# Patient Record
Sex: Female | Born: 1975 | Race: White | Hispanic: No | Marital: Married | Smoking: Former smoker
Health system: Southern US, Community
[De-identification: ages and names within clinical notes are randomized; demographics above are authoritative.]

## PROBLEM LIST (undated history)

## (undated) DIAGNOSIS — N760 Acute vaginitis: Secondary | ICD-10-CM

## (undated) DIAGNOSIS — B9689 Other specified bacterial agents as the cause of diseases classified elsewhere: Secondary | ICD-10-CM

## (undated) DIAGNOSIS — R102 Pelvic and perineal pain: Secondary | ICD-10-CM

## (undated) DIAGNOSIS — N63 Unspecified lump in unspecified breast: Secondary | ICD-10-CM

## (undated) DIAGNOSIS — B009 Herpesviral infection, unspecified: Secondary | ICD-10-CM

## (undated) DIAGNOSIS — N390 Urinary tract infection, site not specified: Secondary | ICD-10-CM

## (undated) DIAGNOSIS — IMO0002 Reserved for concepts with insufficient information to code with codable children: Secondary | ICD-10-CM

## (undated) HISTORY — DX: Reserved for concepts with insufficient information to code with codable children: IMO0002

## (undated) HISTORY — DX: Unspecified lump in unspecified breast: N63.0

## (undated) HISTORY — DX: Pelvic and perineal pain: R10.2

## (undated) HISTORY — DX: Acute vaginitis: N76.0

## (undated) HISTORY — DX: Other specified bacterial agents as the cause of diseases classified elsewhere: B96.89

## (undated) HISTORY — DX: Herpesviral infection, unspecified: B00.9

## (undated) HISTORY — DX: Urinary tract infection, site not specified: N39.0

---

## 2001-04-02 ENCOUNTER — Other Ambulatory Visit: Admission: RE | Admit: 2001-04-02 | Discharge: 2001-04-02 | Payer: Self-pay | Admitting: Obstetrics and Gynecology

## 2002-04-07 ENCOUNTER — Other Ambulatory Visit: Admission: RE | Admit: 2002-04-07 | Discharge: 2002-04-07 | Payer: Self-pay | Admitting: Obstetrics and Gynecology

## 2003-05-04 ENCOUNTER — Other Ambulatory Visit: Admission: RE | Admit: 2003-05-04 | Discharge: 2003-05-04 | Payer: Self-pay | Admitting: Obstetrics and Gynecology

## 2004-05-30 ENCOUNTER — Other Ambulatory Visit: Admission: RE | Admit: 2004-05-30 | Discharge: 2004-05-30 | Payer: Self-pay | Admitting: Obstetrics and Gynecology

## 2005-06-12 ENCOUNTER — Other Ambulatory Visit: Admission: RE | Admit: 2005-06-12 | Discharge: 2005-06-12 | Payer: Self-pay | Admitting: Obstetrics and Gynecology

## 2007-03-07 ENCOUNTER — Encounter: Admission: RE | Admit: 2007-03-07 | Discharge: 2007-03-07 | Payer: Self-pay | Admitting: Obstetrics and Gynecology

## 2007-06-18 DIAGNOSIS — IMO0002 Reserved for concepts with insufficient information to code with codable children: Secondary | ICD-10-CM

## 2007-06-18 HISTORY — DX: Reserved for concepts with insufficient information to code with codable children: IMO0002

## 2007-07-17 ENCOUNTER — Ambulatory Visit: Payer: Self-pay | Admitting: Family Medicine

## 2007-09-09 HISTORY — PX: COLPOSCOPY: SHX161

## 2007-10-18 ENCOUNTER — Emergency Department (HOSPITAL_BASED_OUTPATIENT_CLINIC_OR_DEPARTMENT_OTHER): Admission: EM | Admit: 2007-10-18 | Discharge: 2007-10-18 | Payer: Self-pay | Admitting: Emergency Medicine

## 2008-08-03 ENCOUNTER — Emergency Department (HOSPITAL_COMMUNITY): Admission: EM | Admit: 2008-08-03 | Discharge: 2008-08-03 | Payer: Self-pay | Admitting: Family Medicine

## 2008-09-21 ENCOUNTER — Emergency Department (HOSPITAL_BASED_OUTPATIENT_CLINIC_OR_DEPARTMENT_OTHER): Admission: EM | Admit: 2008-09-21 | Discharge: 2008-09-21 | Payer: Self-pay | Admitting: Emergency Medicine

## 2008-09-21 ENCOUNTER — Ambulatory Visit: Payer: Self-pay | Admitting: Radiology

## 2009-02-06 ENCOUNTER — Emergency Department (HOSPITAL_COMMUNITY): Admission: EM | Admit: 2009-02-06 | Discharge: 2009-02-06 | Payer: Self-pay | Admitting: Family Medicine

## 2010-05-25 ENCOUNTER — Other Ambulatory Visit: Payer: Self-pay | Admitting: Obstetrics and Gynecology

## 2010-05-25 DIAGNOSIS — Z1239 Encounter for other screening for malignant neoplasm of breast: Secondary | ICD-10-CM

## 2010-05-25 DIAGNOSIS — Z1231 Encounter for screening mammogram for malignant neoplasm of breast: Secondary | ICD-10-CM

## 2010-06-02 ENCOUNTER — Ambulatory Visit
Admission: RE | Admit: 2010-06-02 | Discharge: 2010-06-02 | Disposition: A | Discharge status: Home / Self Care | Payer: BC Managed Care – PPO | Source: Ambulatory Visit | Attending: Obstetrics and Gynecology | Admitting: Obstetrics and Gynecology

## 2010-06-02 DIAGNOSIS — Z1231 Encounter for screening mammogram for malignant neoplasm of breast: Secondary | ICD-10-CM

## 2010-07-28 LAB — POCT I-STAT, CHEM 8
BUN: 7 mg/dL (ref 6–23)
Calcium, Ion: 1.11 mmol/L — ABNORMAL LOW (ref 1.12–1.32)
Chloride: 104 mEq/L (ref 96–112)
Creatinine, Ser: 0.8 mg/dL (ref 0.4–1.2)
Glucose, Bld: 90 mg/dL (ref 70–99)
Hemoglobin: 15 g/dL (ref 12.0–15.0)
Potassium: 3.8 mEq/L (ref 3.5–5.1)
TCO2: 24 mmol/L (ref 0–100)

## 2010-08-03 LAB — HIV ANTIBODY (ROUTINE TESTING W REFLEX): HIV: NONREACTIVE

## 2011-01-19 LAB — URINALYSIS, ROUTINE W REFLEX MICROSCOPIC
Bilirubin Urine: NEGATIVE
Protein, ur: NEGATIVE
Urobilinogen, UA: 0.2
pH: 5.5

## 2011-01-19 LAB — CBC
Hemoglobin: 13.7
MCHC: 34.7
MCV: 84.7
RDW: 12.4

## 2011-01-19 LAB — PREGNANCY, URINE: Preg Test, Ur: NEGATIVE

## 2011-03-12 ENCOUNTER — Encounter: Payer: Self-pay | Admitting: *Deleted

## 2011-03-12 ENCOUNTER — Emergency Department (HOSPITAL_BASED_OUTPATIENT_CLINIC_OR_DEPARTMENT_OTHER)
Admission: EM | Admit: 2011-03-12 | Discharge: 2011-03-13 | Disposition: A | Discharge status: Home / Self Care | Payer: BC Managed Care – PPO | Attending: Emergency Medicine | Admitting: Emergency Medicine

## 2011-03-12 ENCOUNTER — Emergency Department (INDEPENDENT_AMBULATORY_CARE_PROVIDER_SITE_OTHER): Payer: BC Managed Care – PPO

## 2011-03-12 DIAGNOSIS — E041 Nontoxic single thyroid nodule: Secondary | ICD-10-CM

## 2011-03-12 DIAGNOSIS — R220 Localized swelling, mass and lump, head: Secondary | ICD-10-CM | POA: Insufficient documentation

## 2011-03-12 DIAGNOSIS — J029 Acute pharyngitis, unspecified: Secondary | ICD-10-CM

## 2011-03-12 LAB — BASIC METABOLIC PANEL
Calcium: 9.4 mg/dL (ref 8.4–10.5)
Creatinine, Ser: 0.6 mg/dL (ref 0.50–1.10)
GFR calc Af Amer: >90 mL/min (ref >90)
GFR calc non Af Amer: >90 mL/min (ref >90)

## 2011-03-12 LAB — DIFFERENTIAL
Eosinophils Absolute: 0.1 10*3/uL (ref 0.0–0.7)
Eosinophils Relative: 2 % (ref 0–5)
Lymphs Abs: 1.8 10*3/uL (ref 0.7–4.0)
Monocytes Absolute: 0.8 10*3/uL (ref 0.1–1.0)
Monocytes Relative: 11 % (ref 3–12)
Neutrophils Relative %: 62 % (ref 43–77)

## 2011-03-12 LAB — CBC
HCT: 40.4 % (ref 36.0–46.0)
MCHC: 34.7 g/dL (ref 30.0–36.0)
MCV: 83.8 fL (ref 78.0–100.0)
Platelets: 232 10*3/uL (ref 150–400)
RBC: 4.82 MIL/uL (ref 3.87–5.11)

## 2011-03-12 MED ORDER — IOHEXOL 300 MG/ML  SOLN
80.0000 mL | Freq: Once | INTRAMUSCULAR | Status: AC | PRN
  Administered 2011-03-12: 80 mL via INTRAVENOUS

## 2011-03-12 NOTE — ED Provider Notes (Signed)
History     CSN: [id number] Arrival date & time: 03/12/2011  8:24 PM   First MD Initiated Contact with Patient 03/12/11 2111      Chief Complaint  Patient presents with  . neck swelling     (Consider location/radiation/quality/duration/timing/severity/associated sxs/prior treatment) HPI Comments: Pt comes in c/o swelling to her neck that started this morning:pt states that she has not had cough, fever, sore throat, congestion or injury:pt denies any history of similar symptoms  The history is provided by the patient. No language interpreter was used.    History reviewed. No pertinent past medical history.  History reviewed. No pertinent past surgical history.  History reviewed. No pertinent family history.  History  Substance Use Topics  . Smoking status: Never Smoker   . Smokeless tobacco: Not on file  . Alcohol Use: No    OB History    Grav Para Term Preterm Abortions TAB SAB Ect Mult Living                  Review of Systems  All other systems reviewed and are negative.    Allergies  Penicillins  Home Medications   Current Outpatient Rx  Name Route Sig Dispense Refill  . THERA M PLUS PO TABS Oral Take 1 tablet by mouth daily.      . ST JOHNS WORT 300 MG PO CAPS Oral Take 300 mg by mouth daily.       BP 111/67  Pulse 86  Temp(Src) 98.8 F (37.1 C) (Oral)  Resp 16  Wt 117 lb (53.071 kg)  SpO2 99%  LMP 03/03/2011  Physical Exam  Nursing note and vitals reviewed. Constitutional: She is oriented to person, place, and time. She appears well-developed and well-nourished.  HENT:  Right Ear: External ear normal.  Left Ear: External ear normal.  Mouth/Throat: Oropharynx is clear and moist.  Eyes: Pupils are equal, round, and reactive to light.  Cardiovascular: Normal rate and regular rhythm.   Pulmonary/Chest: Effort normal and breath sounds normal.  Lymphadenopathy:       Pt has localized swelling to the right lower neck  Neurological: She is  alert and oriented to person, place, and time.  Skin: Skin is warm.  Psychiatric: She has a normal mood and affect.    ED Course  Procedures (including critical care time)  Labs Reviewed - No data to display Ct Soft Tissue Neck W Contrast  03/13/2011  *RADIOLOGY REPORT*  Clinical Data: Next soreness, swelling.  CT NECK WITH CONTRAST  Technique:  Multidetector CT imaging of the neck was performed with intravenous contrast.  Contrast: 80mL OMNIPAQUE IOHEXOL 300 MG/ML IV SOLN  Comparison: None.  Findings: Nasal cavity and nasopharynx within normal limits.  Mild palatine tonsil prominence.  No peritonsillar abscess.  Normal epiglottis.  Normal hypopharynx.  Muscles of mastication within normal limits.  Overlying soft tissues are within normal limits.  Symmetric parotid and submandibular glands.  No lymphadenopathy. The right internal jugular vein is dominant, relatively diminutive on the left.  Bilateral carotid and vertebral arteries are patent. Visualized intracranial contents are within normal limits.  Lung apices are clear.  No acute osseous abnormality.  2.3 cm heterogeneous attenuation nodule within the right lobe of the thyroid gland, deviates the trachea slightly to the left.  IMPRESSION: Mild palatine tonsil prominence without peritonsillar abscess.  2.3 cm nodule within the right lobe of the thyroid gland, measuring 2.3 cm.  Recommend further evaluation with ultrasound and biopsy if warranted.  Original  Report Authenticated By: Waneta Martins, M.D.     1. Thyroid nodule       MDM  Discussed finding with WU:JWJX refer to ent    Medical screening examination/treatment/procedure(s) were conducted as a shared visit with non-physician practitioner(s) and myself.  I personally evaluated the patient during the encounter Osvaldo Human, M.D.     Teressa Lower, NP 03/13/11 [id number]  Carleene Cooper III, MD 03/13/11 1344

## 2011-03-12 NOTE — ED Notes (Signed)
Pt reports neck soreness this am and later her neck began to swell denies breathing difficulty denies recent illness

## 2011-03-13 NOTE — Discharge Instructions (Signed)
Goiter Goiter is an enlarged thyroid gland. The thyroid gland sits at the base of the front of the neck. The gland produces hormones that regulate mood, body temperature, pulse rate, and digestion. Most goiters are painless and are not a cause for serious concern. Goiters and conditions that cause goiters can be treated if necessary.  CAUSES  Common causes of goiter include:  Graves disease (causes too much hormone to be produced [hyperthyroidism]).   Hashimoto's disease (causes too little hormone to be produced [hypothyroidism]).   Thyroiditis (inflammation of the thyroid sometimes caused by virus or pregnancy).   Nodular goiter (small bumps form; sometimes called toxic nodular goiter).   Pregnancy.   Thyroid cancer (very few goiters with nodules are cancerous).   Certain medications.   Radiation exposure.   Iodine deficiency (more common in developing countries in inland populations).  RISK FACTORS Risk factors for goiter include:  A family history of goiter.   Female gender.   Inadequate iodine in the diet.   Age older than 40 years.  SYMPTOMS  Many goiters do not cause symptoms. When symptoms do occur, they may include:  Swelling in the lower part of the neck. This swelling can range from a very small bump to a large lump.   A tight feeling in the throat.   A hoarse voice.  Less commonly, a goiter may result in:  Coughing.   Wheezing.   Difficulty swallowing.   Difficulty breathing.   Bulging neck veins.   Dizziness.  When a goiter is the result of hyperthyroidism, symptoms may include:  Rapid or irregular heart beat.   Sicknessin your stomach (nausea).   Vomiting.   Diarrhea.   Shaking.   Irritable feeling.   Bulging eyes.   Weight loss.   Heat sensitivity.   Anxiety.  When a goiter is the result of hypothyroidism, symptoms may include:  Tiredness.   Dry skin.   Constipation.   Weight gain.   Irregular menstrual cycle.    Depressed mood.   Sensitivity to cold.  DIAGNOSIS  Tests used to diagnose goiter include:  A physical exam.   Blood tests, including thyroid hormone levels and antibody testing.   Ultrasonography, computerized X-ray scan (computed tomography, CT) or computerized magnetic scan (magnetic resonance imaging, MRI).   Thyroid scan (imaging along with safe radioactive injection).   Tissue sample taken (biopsy) of nodules. This is sometimes done to confirm that the nodules are not cancerous.  TREATMENT  Treatment will depend on the cause of the goiter. Treatment may include:  Monitoring. In some cases, no treatment is necessary, and your doctor will monitor yourcondition at regular check ups.   Medications and supplements. Thyroid medication (thyroid hormone replacement) is available for hyperthroidism and hypothyroidism.   If inflammation is the cause, over-the-counter medication or steroid medication may be recommended.   Goiters caused by iodine deficiency can be treated with iodine supplements or changes in diet.   Radioactive iodine treatment. Radioactive iodine is injected into the blood. It travels to the thyroid gland, kills thyroid cells, and reduces the size of the gland. This is only used when the thyroid gland is overactive. Lifelong thyroid hormone medication is often necessary after this treatment.   Surgery. A procedure to remove all or part of the gland may be recommended in severe cases or when cancer is the cause. Hormones can be taken to replace the hormones normally produced by the thyroid.  HOME CARE INSTRUCTIONS   Take medications as directed.     Follow your caregiver's recommendations for any dietary changes.   Follow up with your caregiver for further examination and testing, as directed.  PREVENTION   If you have a family history of goiter, discuss screening with your doctor.   Make sure you are getting enough iodine in your diet.   Use of iodized table  salt can help prevent iodine deficiency.  Document Released: 09/28/2009 Document Revised: 12/21/2010 Document Reviewed: 09/28/2009 Exit[employer] Patient Information 2012 [employer]. 

## 2011-03-13 NOTE — ED Provider Notes (Signed)
12:12 AM Patient is a 35 year old woman who noted a nodule in her right neck today for the first time. Physical exam shows a 2 cm nodule to the right of the trachea, which feels like a thyroid nodule to me, and moves like one with swallowing. CT of the neck with IV contrast has been ordered. In the event the radiologist feels that this is a thyroid nodule, referral to a general surgeon should be offered, for probable ultrasound and aspiration.  Medical screening examination/treatment/procedure(s) were conducted as a shared visit with non-physician practitioner(s) and myself.  I personally evaluated the patient during the encounter Osvaldo Human, M.D.    Carleene Cooper III, MD 03/13/11 [phone]

## 2011-03-22 ENCOUNTER — Other Ambulatory Visit: Payer: Self-pay | Admitting: Otolaryngology

## 2011-03-22 DIAGNOSIS — E041 Nontoxic single thyroid nodule: Secondary | ICD-10-CM

## 2011-03-30 ENCOUNTER — Ambulatory Visit
Admission: RE | Admit: 2011-03-30 | Discharge: 2011-03-30 | Disposition: A | Discharge status: Home / Self Care | Payer: BC Managed Care – PPO | Source: Ambulatory Visit | Attending: Otolaryngology | Admitting: Otolaryngology

## 2011-03-30 DIAGNOSIS — E041 Nontoxic single thyroid nodule: Secondary | ICD-10-CM

## 2011-04-03 ENCOUNTER — Other Ambulatory Visit: Payer: Self-pay | Admitting: Otolaryngology

## 2011-04-03 DIAGNOSIS — E041 Nontoxic single thyroid nodule: Secondary | ICD-10-CM

## 2011-04-05 ENCOUNTER — Other Ambulatory Visit (HOSPITAL_COMMUNITY)
Admission: RE | Admit: 2011-04-05 | Discharge: 2011-04-05 | Disposition: A | Discharge status: Home / Self Care | Payer: BC Managed Care – PPO | Source: Ambulatory Visit | Attending: Interventional Radiology | Admitting: Interventional Radiology

## 2011-04-05 ENCOUNTER — Ambulatory Visit
Admission: RE | Admit: 2011-04-05 | Discharge: 2011-04-05 | Disposition: A | Discharge status: Home / Self Care | Payer: BC Managed Care – PPO | Source: Ambulatory Visit | Attending: Otolaryngology | Admitting: Otolaryngology

## 2011-04-05 DIAGNOSIS — E041 Nontoxic single thyroid nodule: Secondary | ICD-10-CM

## 2011-04-05 DIAGNOSIS — E049 Nontoxic goiter, unspecified: Secondary | ICD-10-CM | POA: Insufficient documentation

## 2011-10-11 ENCOUNTER — Other Ambulatory Visit: Payer: Self-pay | Admitting: Otolaryngology

## 2011-10-11 DIAGNOSIS — E041 Nontoxic single thyroid nodule: Secondary | ICD-10-CM

## 2011-11-22 ENCOUNTER — Encounter: Payer: BC Managed Care – PPO | Admitting: Obstetrics and Gynecology

## 2011-11-24 ENCOUNTER — Encounter: Payer: BC Managed Care – PPO | Admitting: Obstetrics and Gynecology

## 2011-11-27 ENCOUNTER — Encounter: Payer: BC Managed Care – PPO | Admitting: Obstetrics and Gynecology

## 2012-06-19 ENCOUNTER — Ambulatory Visit: Payer: BC Managed Care – PPO | Admitting: Obstetrics and Gynecology

## 2017-01-06 ENCOUNTER — Emergency Department (HOSPITAL_BASED_OUTPATIENT_CLINIC_OR_DEPARTMENT_OTHER)
Admission: EM | Admit: 2017-01-06 | Discharge: 2017-01-06 | Disposition: A | Discharge status: Home / Self Care | Payer: BC Managed Care – PPO | Attending: Emergency Medicine | Admitting: Emergency Medicine

## 2017-01-06 ENCOUNTER — Encounter (HOSPITAL_BASED_OUTPATIENT_CLINIC_OR_DEPARTMENT_OTHER): Payer: Self-pay | Admitting: Emergency Medicine

## 2017-01-06 DIAGNOSIS — K2900 Acute gastritis without bleeding: Secondary | ICD-10-CM | POA: Insufficient documentation

## 2017-01-06 DIAGNOSIS — R1084 Generalized abdominal pain: Secondary | ICD-10-CM | POA: Diagnosis present

## 2017-01-06 DIAGNOSIS — Z79899 Other long term (current) drug therapy: Secondary | ICD-10-CM | POA: Insufficient documentation

## 2017-01-06 LAB — CBC
HEMATOCRIT: 46.1 % — AB (ref 36.0–46.0)
HEMOGLOBIN: 15.8 g/dL — AB (ref 12.0–15.0)
MCH: 30.2 pg (ref 26.0–34.0)
MCHC: 34.3 g/dL (ref 30.0–36.0)
MCV: 88 fL (ref 78.0–100.0)
Platelets: 317 10*3/uL (ref 150–400)
RBC: 5.24 MIL/uL — ABNORMAL HIGH (ref 3.87–5.11)
RDW: 12.8 % (ref 11.5–15.5)
WBC: 6.7 10*3/uL (ref 4.0–10.5)

## 2017-01-06 LAB — COMPREHENSIVE METABOLIC PANEL
ALBUMIN: 4.2 g/dL (ref 3.5–5.0)
ALK PHOS: 69 U/L (ref 38–126)
ALT: 23 U/L (ref 14–54)
ANION GAP: 7 (ref 5–15)
AST: 25 U/L (ref 15–41)
BUN: 9 mg/dL (ref 6–20)
CALCIUM: 9.4 mg/dL (ref 8.9–10.3)
CHLORIDE: 104 mmol/L (ref 101–111)
CO2: 26 mmol/L (ref 22–32)
Creatinine, Ser: 0.65 mg/dL (ref 0.44–1.00)
GFR calc Af Amer: >60 mL/min (ref >60)
GFR calc non Af Amer: >60 mL/min (ref >60)
GLUCOSE: 106 mg/dL — AB (ref 65–99)
POTASSIUM: 4.1 mmol/L (ref 3.5–5.1)
SODIUM: 137 mmol/L (ref 135–145)
Total Bilirubin: 0.5 mg/dL (ref 0.3–1.2)
Total Protein: 7.7 g/dL (ref 6.5–8.1)

## 2017-01-06 LAB — LIPASE, BLOOD: LIPASE: 26 U/L (ref 11–51)

## 2017-01-06 LAB — PREGNANCY, URINE: Preg Test, Ur: NEGATIVE

## 2017-01-06 LAB — URINALYSIS, ROUTINE W REFLEX MICROSCOPIC
BILIRUBIN URINE: NEGATIVE
Glucose, UA: NEGATIVE mg/dL
Hgb urine dipstick: NEGATIVE
Ketones, ur: NEGATIVE mg/dL
Leukocytes, UA: NEGATIVE
NITRITE: NEGATIVE
PH: 5.5 (ref 5.0–8.0)
Protein, ur: NEGATIVE mg/dL

## 2017-01-06 MED ORDER — DICYCLOMINE HCL 20 MG PO TABS: 20.0000 mg | ORAL_TABLET | Freq: Three times a day (TID) | ORAL | 0 refills | Status: DC

## 2017-01-06 MED ORDER — PANTOPRAZOLE SODIUM 20 MG PO TBEC: 40.0000 mg | DELAYED_RELEASE_TABLET | Freq: Every day | ORAL | 0 refills | Status: DC

## 2017-01-06 NOTE — ED Triage Notes (Signed)
patinet reports that for the last 2 -3 weeks she has had intermittent "burning" to her generalized abdominal area around to her bilateral flank. She she also reports that she has had a "cold" for about a week and half for about 1 week. Patient denies any urinary symptoms but reports that she is very fatigued.

## 2017-01-06 NOTE — ED Provider Notes (Signed)
[facility] Provider [facility]   CSN: [id number] Arrival date & time: 01/06/17  1434     History   Chief Complaint Chief Complaint  Patient presents with  . Abdominal Pain    HPI Lauren Duarte is a 41 y.o. female.  HPI  Lower abdominal pain, feels like a burning, dull ache with radiation around to the back, constant dull aching back pain x one month. Back pain present for one week.  Bouts of diarrhea and constipation, has been going on for one month. Constipation for 3 days at times. Today had loose stool.  No regular BM, will have constipation for 3 days, sometimes will take stool softener. Reports cough for 1.5wk, feels like need to cough stuff up but is unable to, when laying down at night will have cough Then not sleeping well at night because up coughing.  No family hx of asthma.  Has had congestion for past 1.5, rhinorhea, usually clear, sometimes slight green color.    No vaginal bleeding or discharge. No concern for STIs, is sexually, one partner. On birth control, last menses 9/8.  Getting IUD on Monday through OBGYN.  She had referred her to Gastroenterologist about the stomach pain.  Had PAP at that time, pelvic exam and they did see any abnormalities, did not think had PID.      Past Medical History:  Diagnosis Date  . Abnormal Pap smear 06/18/07   ASCUS  . Breast nodule    left breast  . BV (bacterial vaginosis)   . HSV-1 (herpes simplex virus 1) infection   . Pelvic pain in female   . UTI (urinary tract infection)     There are no active problems to display for this patient.   Past Surgical History:  Procedure Laterality Date  . COLPOSCOPY  09/09/07    OB History    Gravida Para Term Preterm AB Living   SAB TAB Ectopic Multiple Live Births                   Home Medications    Prior to Admission medications   Medication Sig Start Date End Date Taking? Authorizing Provider  dicyclomine (BENTYL) 20 MG tablet Take 1 tablet (20  mg total) by mouth 3 (three) times daily before meals. 01/06/17   Alvira Monday, MD  IUD's [facility] INTRAUTERINE COPPER IU) by Intrauterine route.    [provider]  Multiple Vitamins-Minerals (MULTIVITAMINS THER. W/MINERALS) TABS Take 1 tablet by mouth daily.      [provider]  pantoprazole (PROTONIX) 20 MG tablet Take 2 tablets (40 mg total) by mouth daily. 01/06/17 01/20/17  Alvira Monday, MD  [facility] Wort 300 MG CAPS Take 300 mg by mouth daily.     [provider]    Family History History reviewed. No pertinent family history.  Social History Social History  Substance Use Topics  . Smoking status: Never Smoker  . Smokeless tobacco: Never Used  . Alcohol use No     Allergies   Penicillins   Review of Systems Review of Systems  Constitutional: Positive for fatigue. Negative for fever.  HENT: Positive for congestion and rhinorrhea. Negative for sore throat (now resolvecd).   Eyes: Negative for visual disturbance.  Respiratory: Positive for cough. Negative for shortness of breath.   Cardiovascular: Negative for chest pain.  Gastrointestinal: Positive for abdominal pain, constipation and diarrhea. Negative for nausea and vomiting.  Genitourinary:  Positive for pelvic pain. Negative for difficulty urinating, vaginal bleeding and vaginal discharge.  Musculoskeletal: Negative for back pain and neck pain.  Skin: Negative for rash.  Neurological: Negative for syncope and headaches.     Physical Exam Updated Vital Signs BP 130/85 (BP Location: Right Arm)   Pulse 67   Temp 98.3 F (36.8 C) (Oral)   Resp 16   Ht 5' 2.5" (1.588 m)   Wt 54.4 kg (120 lb)   LMP 12/30/2016   SpO2 100%   BMI 21.60 kg/m   Physical Exam  Constitutional: She is oriented to person, place, and time. She appears well-developed and well-nourished. No distress.  HENT:  Head: Normocephalic and atraumatic.  Eyes: Conjunctivae and EOM are normal.  Neck: Normal range  of motion.  Cardiovascular: Normal rate, regular rhythm, normal heart sounds and intact distal pulses.  Exam reveals no gallop and no friction rub.   No murmur heard. Pulmonary/Chest: Effort normal and breath sounds normal. No respiratory distress. She has no wheezes. She has no rales.  Abdominal: Soft. She exhibits no distension. There is no tenderness. There is no guarding.  Musculoskeletal: She exhibits no edema or tenderness.  Neurological: She is alert and oriented to person, place, and time.  Skin: Skin is warm and dry. No rash noted. She is not diaphoretic. No erythema.  Nursing note and vitals reviewed.    ED Treatments / Results  Labs (all labs ordered are listed, but only abnormal results are displayed) Labs Reviewed  URINALYSIS, ROUTINE W REFLEX MICROSCOPIC - Abnormal; Notable for the following:       Result Value   Specific Gravity, Urine <1.005 (*)    All other components within normal limits  COMPREHENSIVE METABOLIC PANEL - Abnormal; Notable for the following:    Glucose, Bld 106 (*)    All other components within normal limits  CBC - Abnormal; Notable for the following:    RBC 5.24 (*)    Hemoglobin 15.8 (*)    HCT 46.1 (*)    All other components within normal limits  PREGNANCY, URINE  LIPASE, BLOOD    EKG  EKG Interpretation None       Radiology No results found.  Procedures Procedures (including critical care time)  Medications Ordered in ED Medications - No data to display   Initial Impression / Assessment and Plan / ED Course  I have reviewed the triage vital signs and the nursing notes.  Pertinent labs & imaging results that were available during my care of the patient were reviewed by me and considered in my medical decision making (see chart for details).    41 year old female presents with concern for lower abdominal pain, alternating constipation and diarrhea, cough. CBC shows no anemia or leukocytosis. CMP shows no transaminitis or  abnormal lites. Lipase is within normal limits. Pregnancy test is negative. Urinalysis shows no sign of UTI. History is not consistent with nephrolithiasis. She recently had an evaluation with her OB/GYN including a pelvic exam and there is no concern at that time for PID. Doubt appendicitis, cholecystitis by history and exam.  Reports cough for 1.5 weeks. She has clear breath sounds bilaterally, no hypoxia, no fever, doubt pneumonia.  Symptoms possible gastritis, IBS or other. Recommend continued close outpt follow up. GIven rx for Protonix and Bentyl. Patient discharged in stable condition with understanding of reasons to return.   Final Clinical Impressions(s) / ED Diagnoses   Final diagnoses:  Generalized abdominal pain  Acute gastritis without hemorrhage,  unspecified gastritis type    New Prescriptions Discharge Medication List as of 01/06/2017  5:40 PM    START taking these medications   Details  dicyclomine (BENTYL) 20 MG tablet Take 1 tablet (20 mg total) by mouth 3 (three) times daily before meals., Starting Sat 01/06/2017, Print    pantoprazole (PROTONIX) 20 MG tablet Take 2 tablets (40 mg total) by mouth daily., Starting Sat 01/06/2017, Until Sat 01/20/2017, Print         Alvira Monday, MD 01/08/17 [phone]

## 2017-01-06 NOTE — ED Notes (Signed)
ED Provider at bedside. 

## 2017-11-02 LAB — HM PAP SMEAR

## 2018-10-03 ENCOUNTER — Emergency Department (HOSPITAL_BASED_OUTPATIENT_CLINIC_OR_DEPARTMENT_OTHER): Payer: BC Managed Care – PPO

## 2018-10-03 ENCOUNTER — Other Ambulatory Visit: Payer: Self-pay

## 2018-10-03 ENCOUNTER — Emergency Department (HOSPITAL_BASED_OUTPATIENT_CLINIC_OR_DEPARTMENT_OTHER)
Admission: EM | Admit: 2018-10-03 | Discharge: 2018-10-03 | Disposition: A | Discharge status: Home / Self Care | Payer: BC Managed Care – PPO | Attending: Emergency Medicine | Admitting: Emergency Medicine

## 2018-10-03 ENCOUNTER — Encounter (HOSPITAL_BASED_OUTPATIENT_CLINIC_OR_DEPARTMENT_OTHER): Payer: Self-pay | Admitting: Emergency Medicine

## 2018-10-03 DIAGNOSIS — R200 Anesthesia of skin: Secondary | ICD-10-CM | POA: Diagnosis not present

## 2018-10-03 DIAGNOSIS — R079 Chest pain, unspecified: Secondary | ICD-10-CM | POA: Diagnosis not present

## 2018-10-03 DIAGNOSIS — F17200 Nicotine dependence, unspecified, uncomplicated: Secondary | ICD-10-CM | POA: Insufficient documentation

## 2018-10-03 DIAGNOSIS — R202 Paresthesia of skin: Secondary | ICD-10-CM | POA: Diagnosis not present

## 2018-10-03 LAB — BASIC METABOLIC PANEL
Anion gap: 7 (ref 5–15)
BUN: 14 mg/dL (ref 6–20)
CO2: 24 mmol/L (ref 22–32)
Calcium: 9 mg/dL (ref 8.9–10.3)
Chloride: 105 mmol/L (ref 98–111)
Creatinine, Ser: 0.65 mg/dL (ref 0.44–1.00)
GFR calc Af Amer: >60 mL/min (ref >60)
GFR calc non Af Amer: >60 mL/min (ref >60)
Glucose, Bld: 110 mg/dL — ABNORMAL HIGH (ref 70–99)
Potassium: 3.7 mmol/L (ref 3.5–5.1)
Sodium: 136 mmol/L (ref 135–145)

## 2018-10-03 LAB — CBC WITH DIFFERENTIAL/PLATELET
Abs Immature Granulocytes: 0.02 10*3/uL (ref 0.00–0.07)
Basophils Absolute: 0 10*3/uL (ref 0.0–0.1)
Basophils Relative: 0 %
Eosinophils Absolute: 0.2 10*3/uL (ref 0.0–0.5)
Eosinophils Relative: 3 %
HCT: 45.3 % (ref 36.0–46.0)
Hemoglobin: 14.8 g/dL (ref 12.0–15.0)
Immature Granulocytes: 0 %
Lymphocytes Relative: 34 %
Lymphs Abs: 2.4 10*3/uL (ref 0.7–4.0)
MCH: 29.6 pg (ref 26.0–34.0)
MCHC: 32.7 g/dL (ref 30.0–36.0)
MCV: 90.6 fL (ref 80.0–100.0)
Monocytes Absolute: 0.7 10*3/uL (ref 0.1–1.0)
Monocytes Relative: 9 %
Neutro Abs: 3.8 10*3/uL (ref 1.7–7.7)
Neutrophils Relative %: 54 %
Platelets: 322 10*3/uL (ref 150–400)
RBC: 5 MIL/uL (ref 3.87–5.11)
RDW: 13.1 % (ref 11.5–15.5)
WBC: 7.2 10*3/uL (ref 4.0–10.5)
nRBC: 0 % (ref 0.0–0.2)

## 2018-10-03 LAB — D-DIMER, QUANTITATIVE (NOT AT ARMC): D-Dimer, Quant: 0.38 ug/mL-FEU (ref 0.00–0.50)

## 2018-10-03 LAB — TROPONIN I: Troponin I: <0.03 ng/mL (ref <0.03)

## 2018-10-03 MED ORDER — SODIUM CHLORIDE 0.9 % IV BOLUS: 1000.0000 mL | Freq: Once | INTRAVENOUS | Status: DC

## 2018-10-03 MED ORDER — KETOROLAC TROMETHAMINE 15 MG/ML IJ SOLN
15.0000 mg | Freq: Once | INTRAMUSCULAR | Status: DC
  Filled 2018-10-03: qty 1

## 2018-10-03 MED ORDER — SODIUM CHLORIDE 0.9 % IV BOLUS: 500.0000 mL | Freq: Once | INTRAVENOUS | Status: DC

## 2018-10-03 MED ORDER — IOHEXOL 350 MG/ML SOLN
100.0000 mL | Freq: Once | INTRAVENOUS | Status: AC | PRN
  Administered 2018-10-03: 100 mL via INTRAVENOUS

## 2018-10-03 NOTE — ED Provider Notes (Signed)
[facility] Emergency Department Provider Note MRN:  [id number]  Arrival date & time: 10/03/18     Chief Complaint   Chest Pain   History of Present Illness   Lauren EnsignHeidi Duarte is a 43 y.o. year-old female with no pertinent past medical history presenting to the ED with chief complaint of chest pain.  Chest pain for 2 days, located beneath the left breast, described as sharp, moderate in severity, constant.  Yesterday also began experiencing numbness or paresthesia to the left arm.  Denies headache, no vision change, no fever, no cough, no shortness of breath.  Denies abdominal pain, no bowel or bladder dysfunction, no numbness or weakness to the legs.  Review of Systems  A complete 10 system review of systems was obtained and all systems are negative except as noted in the HPI and PMH.   Patient's Health History    Past Medical History:  Diagnosis Date  . Abnormal Pap smear 06/18/07   ASCUS  . Breast nodule    left breast  . BV (bacterial vaginosis)   . HSV-1 (herpes simplex virus 1) infection   . Pelvic pain in female   . UTI (urinary tract infection)     Past Surgical History:  Procedure Laterality Date  . COLPOSCOPY  09/09/07    No family history on file.  Social History   Socioeconomic History  . Marital status: Single    Spouse name: Not on file  . Number of children: Not on file  . Years of education: Not on file  . Highest education level: Not on file  Occupational History  . Not on file  Social Needs  . Financial resource strain: Not on file  . Food insecurity    Worry: Not on file    Inability: Not on file  . Transportation needs    Medical: Not on file    Non-medical: Not on file  Tobacco Use  . Smoking status: Current Some Day Smoker  . Smokeless tobacco: Never Used  Substance and Sexual Activity  . Alcohol use: Yes  . Drug use: No  . Sexual activity: Not on file  Lifestyle  . Physical activity    Days per week: Not on  file    Minutes per session: Not on file  . Stress: Not on file  Relationships  . Social Musicianconnections    Talks on phone: Not on file    Gets together: Not on file    Attends religious service: Not on file    Active member of club or organization: Not on file    Attends meetings of clubs or organizations: Not on file    Relationship status: Not on file  . Intimate partner violence    Fear of current or ex partner: Not on file    Emotionally abused: Not on file    Physically abused: Not on file    Forced sexual activity: Not on file  Other Topics Concern  . Not on file  Social History Narrative  . Not on file     Physical Exam  Vital Signs and Nursing Notes reviewed Vitals:   10/03/18 0741  BP: 134/82  Pulse: 73  Resp: 18  Temp: (!) 97.3 F (36.3 C)  SpO2: 99%    CONSTITUTIONAL: Well-appearing, NAD NEURO:  Alert and oriented x 3, normal and symmetric strength, subjective minimal decreased sensation to the left arm, no facial droop, no visual field cuts, no aphasia, no dysarthria, no neglect EYES:  eyes equal and reactive ENT/NECK:  no LAD, no JVD CARDIO: Regular rate, well-perfused, normal S1 and S2 PULM:  CTAB no wheezing or rhonchi GI/GU:  normal bowel sounds, non-distended, non-tender MSK/SPINE:  No gross deformities, no edema SKIN:  no rash, atraumatic PSYCH:  Appropriate speech and behavior  Diagnostic and Interventional Summary    EKG Interpretation  Date/Time:  Thursday October 03 2018 07:38:32 EDT Ventricular Rate:  69 PR Interval:    QRS Duration: 89 QT Interval:  411 QTC Calculation: 441 R Axis:   74 Text Interpretation:  Sinus rhythm Baseline wander in lead(s) V6 Confirmed by Gerlene Fee [phone]) on 10/03/2018 9:07:51 AM      Labs Reviewed  BASIC METABOLIC PANEL - Abnormal; Notable for the following components:      Result Value   Glucose, Bld 110 (*)    All other components within normal limits  CBC WITH DIFFERENTIAL/PLATELET  TROPONIN I   D-DIMER, QUANTITATIVE (NOT AT [facility])    CT HEAD WO CONTRAST  Final Result    CT ANGIO CHEST PE W OR WO CONTRAST  Final Result    DG Chest 2 View  Final Result      Medications  ketorolac (TORADOL) 15 MG/ML injection 15 mg (15 mg Intravenous Refused 10/03/18 0824)  sodium chloride 0.9 % bolus 1,000 mL (1,000 mLs Intravenous Refused 10/03/18 0825)  iohexol (OMNIPAQUE) 350 MG/ML injection 100 mL (100 mLs Intravenous Contrast Given 10/03/18 1025)     Procedures Critical Care  ED Course and Medical Decision Making  I have reviewed the triage vital signs and the nursing notes.  Pertinent labs & imaging results that were available during my care of the patient were reviewed by me and considered in my medical decision making (see below for details).  Sharp pleuritic chest pain in this 43 year old female, otherwise healthy.  Considering pulmonary embolism though she is not tachycardic, not hypoxic, no evidence of DVT, not on birth control pills.  Patient is also having sensory symptoms of the left arm, thought to be mostly paresthesias though on exam there is minimal possible subjective numbness as well.  She has a normal neurological exam otherwise and has normal radial pulses, normal cap refill to her extremities.  Dissection should be considered with chest pain and neurological deficit, however this is thought to be very unlikely given her lack of objective findings.  Still, with both PE and dissection on the differential, will obtain a CTA chest imaging to evaluate.  Patient is otherwise healthy without cardiac risk factors, low concern for ACS.  EKG is without ischemic changes.  Labs are reassuring, CT reveals no evidence of PE or dissection.  Upon reevaluation, patient is relieved to hear the news of a negative work-up, she no longer has any subjective sensory deficits, confirming the suspicion that she was experiencing only paresthesia.  Patient is focally tender to the anterior ribs beneath  the left breast, suspect MSK.  Strict return precautions for new or worsening neurological deficits or chest pain.   After the discussed management above, the patient was determined to be safe for discharge.  The patient was in agreement with this plan and all questions regarding their care were answered.  ED return precautions were discussed and the patient will return to the ED with any significant worsening of condition.  Barth Kirks. Sedonia Small, Rachel [email or web address]  Final Clinical Impressions(s) / ED Diagnoses     ICD-10-CM   1. Chest pain,  unspecified type  R07.9     ED Discharge Orders    None         Sabas SousBero, Michael M, MD 10/03/18 [id number]

## 2018-10-03 NOTE — Discharge Instructions (Addendum)
You were evaluated in the Emergency Department and after careful evaluation, we did not find any emergent condition requiring admission or further testing in the hospital.  Your symptoms today seem to be due to muscle strain of the chest wall.  Your labs and CT scans today were reassuring.  Use tylenol or ibuprofen at home for pain.  Please return to the Emergency Department if you experience any worsening of your condition.  We encourage you to follow up with a primary care provider.  Thank you for allowing [place] to be a part of your care.

## 2018-10-03 NOTE — ED Triage Notes (Signed)
L side chest pain x 2 days. Also reports L arm numbness which initially started 2 weeks ago but has been worsening.

## 2018-12-02 LAB — HM MAMMOGRAPHY

## 2019-05-13 ENCOUNTER — Ambulatory Visit: Payer: Self-pay | Admitting: Nurse Practitioner

## 2019-05-29 ENCOUNTER — Encounter: Payer: Self-pay | Admitting: Nurse Practitioner

## 2019-05-29 ENCOUNTER — Other Ambulatory Visit: Payer: Self-pay

## 2019-05-29 ENCOUNTER — Ambulatory Visit: Payer: BC Managed Care – PPO | Admitting: Nurse Practitioner

## 2019-05-29 VITALS — BP 118/80 | HR 72 | Temp 98.6°F | Ht 62.0 in | Wt 136.8 lb

## 2019-05-29 DIAGNOSIS — R1032 Left lower quadrant pain: Secondary | ICD-10-CM

## 2019-05-29 DIAGNOSIS — Z13228 Encounter for screening for other metabolic disorders: Secondary | ICD-10-CM | POA: Diagnosis not present

## 2019-05-29 DIAGNOSIS — Z880 Allergy status to penicillin: Secondary | ICD-10-CM

## 2019-05-29 DIAGNOSIS — Z0000 Encounter for general adult medical examination without abnormal findings: Secondary | ICD-10-CM

## 2019-05-29 LAB — POCT URINALYSIS DIPSTICK
Bilirubin, UA: NEGATIVE
Glucose, UA: NEGATIVE
Ketones, UA: NEGATIVE
Nitrite, UA: NEGATIVE
Protein, UA: POSITIVE — AB
Spec Grav, UA: >=1.03 — AB (ref 1.010–1.025)
Urobilinogen, UA: 0.2 E.U./dL
pH, UA: 5.5 (ref 5.0–8.0)

## 2019-05-29 MED ORDER — EPINEPHRINE 0.3 MG/0.3ML IJ SOAJ: 0.3000 mg | INTRAMUSCULAR | 2 refills | Status: DC | PRN

## 2019-05-29 NOTE — Progress Notes (Addendum)
This visit occurred during the SARS-CoV-2 public health emergency.  Safety protocols were in place, including screening questions prior to the visit, additional usage of staff PPE, and extensive cleaning of exam room while observing appropriate contact time as indicated for disinfecting solutions.  Subjective:     Patient ID: Lauren Duarte , female    DOB: 1975/05/18 , 44 y.o.   MRN: [id number]   Chief Complaint  Patient presents with  . Establish Care  . Annual Exam    HPI  Here to establish care did not have a general practitioner.  She was referred by Dr. Charlesetta Garibaldi. She was seen by Dr. Charlesetta Garibaldi in late summer, had mammogram.  She had her Colonoscopy recently with Dr. Collene Mares - normal. Works as a Pharmacist, hospital, working at [employer]. Single.  She has a 36 year old son who is healthy.   PMH - had been having left lower abdomen pain felt blocked.  She had a goiter 5 years ago, biopsied thyroid levels were slightly elevated and went back to normal.    [facility] - mother - thyroid disease.  Father- occasional elevated blood pressure.  Sister with breast cancer - cancer free for 8 years. Ovarian cancer maternal grandmother.  Maternal grandfather with alzheimer's. Paternal grandmother   Abdominal Pain This is a new problem. The current episode started more than 1 year ago. The onset quality is gradual. The problem occurs intermittently. The pain is located in the LLQ. The quality of the pain is aching. Pertinent negatives include no anorexia. Prior workup: colonoscopy. There is no history of colon cancer.    The patient states she uses IUD for birth control.  Patient's last menstrual period was 05/26/2019.. Negative for Dysmenorrhea and Negative for Menorrhagia Mammogram last done Summer 2020 per patient at Dr. Charlesetta Garibaldi. Negative for: breast discharge, breast lump(s), breast pain and breast self exam.  Pertinent negatives include abnormal bleeding (hematology), anxiety, decreased libido, depression, difficulty  falling sleep, dyspareunia, history of infertility, nocturia, sexual dysfunction, sleep disturbances, urinary incontinence, urinary urgency, vaginal discharge and vaginal itching. Diet regular.The patient states her exercise level is moderate with walking every other day.       The patient's tobacco use is:  Social History   Tobacco Use  Smoking Status Former Smoker  Smokeless Tobacco Never Used  . She has been exposed to passive smoke. The patient's alcohol use is:  Social History   Substance and Sexual Activity  Alcohol Use Yes  Additional information: Last pap done with Dr. Charlesetta Garibaldi will await records  Past Medical History:  Diagnosis Date  . Abnormal Pap smear 06/18/07   ASCUS  . Breast nodule    left breast  . BV (bacterial vaginosis)   . HSV-1 (herpes simplex virus 1) infection   . Pelvic pain in female   . UTI (urinary tract infection)      Family History  Problem Relation Age of Onset  . Thyroid disease Mother   . Healthy Father   . Cancer Sister   . Cancer Maternal Grandmother   . Alzheimer's disease Maternal Grandfather   . Diabetes Paternal Grandmother      Current Outpatient Medications:  .  IUD's (PARAGARD INTRAUTERINE COPPER IU), by Intrauterine route., Disp: , Rfl:  .  Multiple Vitamins-Minerals (MULTIVITAMINS THER. W/MINERALS) TABS, Take 1 tablet by mouth daily.  , Disp: , Rfl:    Allergies  Allergen Reactions  . Penicillins Itching and Swelling     Review of Systems  Constitutional: Negative.  Respiratory: Negative.   Cardiovascular: Negative.   Gastrointestinal: Positive for abdominal pain. Negative for anorexia.  Neurological: Negative.      Today's Vitals   05/29/19 1522  BP: 118/80  Pulse: 72  Temp: 98.6 F (37 C)  TempSrc: Oral  SpO2: 98%  Weight: 136 lb 12.8 oz (62.1 kg)  Height: '5\' 2"'  (1.575 m)  PainSc: 0-No pain   Body mass index is 25.02 kg/m.   Objective:  Physical Exam Constitutional:      General: She is not in  acute distress.    Appearance: Normal appearance.  Eyes:     Extraocular Movements: Extraocular movements intact.     Pupils: Pupils are equal, round, and reactive to light.  Cardiovascular:     Rate and Rhythm: Normal rate and regular rhythm.     Pulses: Normal pulses.     Heart sounds: Normal heart sounds. No murmur.  Pulmonary:     Effort: Pulmonary effort is normal. No respiratory distress.     Breath sounds: Normal breath sounds.  Abdominal:     General: Bowel sounds are normal. There is no distension.     Palpations: Abdomen is soft.  Musculoskeletal:        General: Normal range of motion.  Skin:    General: Skin is warm and dry.     Capillary Refill: Capillary refill takes less than 2 seconds.  Neurological:     General: No focal deficit present.     Mental Status: She is alert and oriented to person, place, and time.  Psychiatric:        Mood and Affect: Mood normal.        Behavior: Behavior normal.        Thought Content: Thought content normal.        Judgment: Judgment normal.         Assessment And Plan:     1. Encounter for screening for metabolic disorder  - Lipid panel - Hemoglobin A1c  2. Health maintenance examination . Behavior modifications discussed and diet history reviewed.   . Pt will continue to exercise regularly and modify diet with low GI, plant based foods and decrease intake of processed foods.  . Recommend intake of daily multivitamin, Vitamin D, and calcium.  . Recommend mammogram for preventive screenings, as well as recommend immunizations that include influenza, TDAP - CMP14+EGFR - Lipid panel  3. Left lower quadrant abdominal pain  No abnormal findings on physical exam - Allergens(96) Foods - CMP14+EGFR - Amylase - Lipase - CBC - POCT Urinalysis Dipstick (81002)  4. Allergy to penicillin  Provided prescription for epi pen at patient request - EPINEPHrine 0.3 mg/0.3 mL IJ SOAJ injection; Inject 0.3 mLs (0.3 mg total) into  the muscle as needed for anaphylaxis.  Dispense: 1 each; Refill: 2   Minette Brine, FNP    THE PATIENT IS ENCOURAGED TO PRACTICE SOCIAL DISTANCING DUE TO THE COVID-19 PANDEMIC.

## 2019-05-29 NOTE — Patient Instructions (Signed)
Health Maintenance  Topic Date Due  . PAP SMEAR-Modifier  05/19/1996  . INFLUENZA VACCINE  07/23/2019 (Originally 11/23/2018)  . TETANUS/TDAP  10/05/2025  . HIV Screening  Completed   Health Maintenance, Female Adopting a healthy lifestyle and getting preventive care are important in promoting health and wellness. Ask your health care provider about:  The right schedule for you to have regular tests and exams.  Things you can do on your own to prevent diseases and keep yourself healthy. What should I know about diet, weight, and exercise? Eat a healthy diet   Eat a diet that includes plenty of vegetables, fruits, low-fat dairy products, and lean protein.  Do not eat a lot of foods that are high in solid fats, added sugars, or sodium. Maintain a healthy weight Body mass index (BMI) is used to identify weight problems. It estimates body fat based on height and weight. Your health care provider can help determine your BMI and help you achieve or maintain a healthy weight. Get regular exercise Get regular exercise. This is one of the most important things you can do for your health. Most adults should:  Exercise for at least 150 minutes each week. The exercise should increase your heart rate and make you sweat (moderate-intensity exercise).  Do strengthening exercises at least twice a week. This is in addition to the moderate-intensity exercise.  Spend less time sitting. Even light physical activity can be beneficial. Watch cholesterol and blood lipids Have your blood tested for lipids and cholesterol at 44 years of age, then have this test every 5 years. Have your cholesterol levels checked more often if:  Your lipid or cholesterol levels are high.  You are older than 44 years of age.  You are at high risk for heart disease. What should I know about cancer screening? Depending on your health history and family history, you may need to have cancer screening at various ages. This may  include screening for:  Breast cancer.  Cervical cancer.  Colorectal cancer.  Skin cancer.  Lung cancer. What should I know about heart disease, diabetes, and high blood pressure? Blood pressure and heart disease  High blood pressure causes heart disease and increases the risk of stroke. This is more likely to develop in people who have high blood pressure readings, are of African descent, or are overweight.  Have your blood pressure checked: ? Every 3-5 years if you are 64-19 years of age. ? Every year if you are 51 years old or older. Diabetes Have regular diabetes screenings. This checks your fasting blood sugar level. Have the screening done:  Once every three years after age 52 if you are at a normal weight and have a low risk for diabetes.  More often and at a younger age if you are overweight or have a high risk for diabetes. What should I know about preventing infection? Hepatitis B If you have a higher risk for hepatitis B, you should be screened for this virus. Talk with your health care provider to find out if you are at risk for hepatitis B infection. Hepatitis C Testing is recommended for:  Everyone born from [birth year] through 1965.  Anyone with known risk factors for hepatitis C. Sexually transmitted infections (STIs)  Get screened for STIs, including gonorrhea and chlamydia, if: ? You are sexually active and are younger than 43 years of age. ? You are older than 44 years of age and your health care provider tells you that you are at  risk for this type of infection. ? Your sexual activity has changed since you were last screened, and you are at increased risk for chlamydia or gonorrhea. Ask your health care provider if you are at risk.  Ask your health care provider about whether you are at high risk for HIV. Your health care provider may recommend a prescription medicine to help prevent HIV infection. If you choose to take medicine to prevent HIV, you should first  get tested for HIV. You should then be tested every 3 months for as long as you are taking the medicine. Pregnancy  If you are about to stop having your period (premenopausal) and you may become pregnant, seek counseling before you get pregnant.  Take 400 to 800 micrograms (mcg) of folic acid every day if you become pregnant.  Ask for birth control (contraception) if you want to prevent pregnancy. Osteoporosis and menopause Osteoporosis is a disease in which the bones lose minerals and strength with aging. This can result in bone fractures. If you are 12 years old or older, or if you are at risk for osteoporosis and fractures, ask your health care provider if you should:  Be screened for bone loss.  Take a calcium or vitamin D supplement to lower your risk of fractures.  Be given hormone replacement therapy (HRT) to treat symptoms of menopause. Follow these instructions at home: Lifestyle  Do not use any products that contain nicotine or tobacco, such as cigarettes, e-cigarettes, and chewing tobacco. If you need help quitting, ask your health care provider.  Do not use street drugs.  Do not share needles.  Ask your health care provider for help if you need support or information about quitting drugs. Alcohol use  Do not drink alcohol if: ? Your health care provider tells you not to drink. ? You are pregnant, may be pregnant, or are planning to become pregnant.  If you drink alcohol: ? Limit how much you use to 0-1 drink a day. ? Limit intake if you are breastfeeding.  Be aware of how much alcohol is in your drink. In the [place] one drink equals one 12 oz bottle of beer (355 mL), one 5 oz glass of wine (148 mL), or one 1 oz glass of hard liquor (44 mL). General instructions  Schedule regular health, dental, and eye exams.  Stay current with your vaccines.  Tell your health care provider if: ? You often feel depressed. ? You have ever been abused or do not feel safe at  home. Summary  Adopting a healthy lifestyle and getting preventive care are important in promoting health and wellness.  Follow your health care provider's instructions about healthy diet, exercising, and getting tested or screened for diseases.  Follow your health care provider's instructions on monitoring your cholesterol and blood pressure. This information is not intended to replace advice given to you by your health care provider. Make sure you discuss any questions you have with your health care provider. Document Revised: 04/03/2018 Document Reviewed: 04/03/2018 Elsevier Patient Education  2020 [employer].

## 2019-05-31 LAB — CMP14+EGFR
ALT: 22 IU/L (ref 0–32)
AST: 22 IU/L (ref 0–40)
Albumin/Globulin Ratio: 2 (ref 1.2–2.2)
Albumin: 4.3 g/dL (ref 3.8–4.8)
Alkaline Phosphatase: 73 IU/L (ref 39–117)
BUN/Creatinine Ratio: 16 (ref 9–23)
BUN: 12 mg/dL (ref 6–24)
Bilirubin Total: 0.5 mg/dL (ref 0.0–1.2)
CO2: 21 mmol/L (ref 20–29)
Calcium: 8.9 mg/dL (ref 8.7–10.2)
Chloride: 103 mmol/L (ref 96–106)
Creatinine, Ser: 0.76 mg/dL (ref 0.57–1.00)
GFR calc Af Amer: 110 mL/min/{1.73_m2} (ref >59)
GFR calc non Af Amer: 96 mL/min/{1.73_m2} (ref >59)
Globulin, Total: 2.2 g/dL (ref 1.5–4.5)
Glucose: 89 mg/dL (ref 65–99)
Potassium: 3.8 mmol/L (ref 3.5–5.2)
Sodium: 139 mmol/L (ref 134–144)
Total Protein: 6.5 g/dL (ref 6.0–8.5)

## 2019-05-31 LAB — ALLERGENS(96) FOODS
Allergen Apple, IgE: <0.1 kU/L
Allergen Banana IgE: <0.1 kU/L
Allergen Barley IgE: <0.1 kU/L
Allergen Black Pepper IgE: <0.1 kU/L
Allergen Blueberry IgE: <0.1 kU/L
Allergen Broccoli: <0.1 kU/L
Allergen Cabbage IgE: <0.1 kU/L
Allergen Carrot IgE: <0.1 kU/L
Allergen Cauliflower IgE: <0.1 kU/L
Allergen Celery IgE: <0.1 kU/L
Allergen Cinnamon IgE: <0.1 kU/L
Allergen Coconut IgE: <0.1 kU/L
Allergen Corn, IgE: <0.1 kU/L
Allergen Cucumber IgE: <0.1 kU/L
Allergen Garlic IgE: <0.1 kU/L
Allergen Ginger IgE: <0.1 kU/L
Allergen Gluten IgE: <0.1 kU/L
Allergen Grape IgE: <0.1 kU/L
Allergen Grapefruit IgE: <0.1 kU/L
Allergen Green Bean IgE: <0.1 kU/L
Allergen Green Bell Pepper IgE: <0.1 kU/L
Allergen Green Pea IgE: <0.1 kU/L
Allergen Lamb IgE: <0.1 kU/L
Allergen Lettuce IgE: <0.1 kU/L
Allergen Lime IgE: <0.1 kU/L
Allergen Melon IgE: <0.1 kU/L
Allergen Oat IgE: <0.1 kU/L
Allergen Onion IgE: <0.1 kU/L
Allergen Pear IgE: <0.1 kU/L
Allergen Potato, White IgE: <0.1 kU/L
Allergen Rice IgE: <0.1 kU/L
Allergen Salmon IgE: <0.1 kU/L
Allergen Strawberry IgE: <0.1 kU/L
Allergen Sweet Potato IgE: <0.1 kU/L
Allergen Tomato, IgE: <0.1 kU/L
Allergen Turkey IgE: <0.1 kU/L
Allergen Watermelon IgE: <0.1 kU/L
Allergen, Peach f95: <0.1 kU/L
Basil: <0.1 kU/L
Beef IgE: <0.1 kU/L
C074-IgE Gelatin: <0.1 kU/L
Chicken IgE: <0.1 kU/L
Chocolate/Cacao IgE: <0.1 kU/L
Clam IgE: <0.1 kU/L
Codfish IgE: <0.1 kU/L
Coffee: <0.1 kU/L
Cranberry IgE: <0.1 kU/L
Egg White IgE: <0.1 kU/L
F020-IgE Almond: <0.1 kU/L
F023-IgE Crab: <0.1 kU/L
F045-IgE Yeast: <0.1 kU/L
F076-IgE Alpha Lactalbumin: <0.1 kU/L
F077-IgE Beta Lactoglobulin: <0.1 kU/L
F078-IgE Casein: <0.1 kU/L
F080-IgE Lobster: <0.1 kU/L
F081-IgE Cheese, Cheddar Type: <0.1 kU/L
F089-IgE Mustard: <0.1 kU/L
F096-IgE Avocado: <0.1 kU/L
F202-IgE Cashew Nut: <0.1 kU/L
F214-IgE Spinach: <0.1 kU/L
F222-IgE Tea: <0.1 kU/L
F242-IgE Bing Cherry: <0.1 kU/L
F247-IgE Honey: <0.1 kU/L
F261-IgE Asparagus: <0.1 kU/L
F262-IgE Eggplant: <0.1 kU/L
F265-IgE Cumin: <0.1 kU/L
F278-IgE Bayleaf (Laurel): <0.1 kU/L
F279-IgE Chili Pepper: <0.1 kU/L
F283-IgE Oregano: <0.1 kU/L
F300-IgE Goat's Milk: <0.1 kU/L
F342-IgE Olive, Black: <0.1 kU/L
F343-IgE Raspberry: <0.1 kU/L
Hops: <0.1 kU/L
IgE Egg (Yolk): <0.1 kU/L
Kidney Bean IgE: <0.1 kU/L
Lemon: <0.1 kU/L
Lima Bean IgE: <0.1 kU/L
Malt: <0.1 kU/L
Mushroom IgE: <0.1 kU/L
Orange: <0.1 kU/L
Peanut IgE: <0.1 kU/L
Pineapple IgE: <0.1 kU/L
Pork IgE: <0.1 kU/L
Pumpkin IgE: <0.1 kU/L
Red Beet: <0.1 kU/L
Rye IgE: <0.1 kU/L
Scallop IgE: <0.1 kU/L
Sesame Seed IgE: <0.1 kU/L
Shrimp IgE: <0.1 kU/L
Soybean IgE: <0.1 kU/L
Tuna: <0.1 kU/L
Vanilla: <0.1 kU/L
Walnut IgE: <0.1 kU/L
Wheat IgE: <0.1 kU/L
Whey: <0.1 kU/L
White Bean IgE: <0.1 kU/L

## 2019-05-31 LAB — CBC
Hematocrit: 41.5 % (ref 34.0–46.6)
Hemoglobin: 13.9 g/dL (ref 11.1–15.9)
MCH: 29.8 pg (ref 26.6–33.0)
MCHC: 33.5 g/dL (ref 31.5–35.7)
MCV: 89 fL (ref 79–97)
Platelets: 320 10*3/uL (ref 150–450)
RBC: 4.67 x10E6/uL (ref 3.77–5.28)
RDW: 12.5 % (ref 11.7–15.4)
WBC: 6.4 10*3/uL (ref 3.4–10.8)

## 2019-05-31 LAB — LIPID PANEL
Chol/HDL Ratio: 2.8 ratio (ref 0.0–4.4)
Cholesterol, Total: 200 mg/dL — ABNORMAL HIGH (ref 100–199)
HDL: 71 mg/dL (ref >39)
LDL Chol Calc (NIH): 115 mg/dL — ABNORMAL HIGH (ref 0–99)
Triglycerides: 77 mg/dL (ref 0–149)
VLDL Cholesterol Cal: 14 mg/dL (ref 5–40)

## 2019-05-31 LAB — HEMOGLOBIN A1C
Est. average glucose Bld gHb Est-mCnc: 103 mg/dL
Hgb A1c MFr Bld: 5.2 % (ref 4.8–5.6)

## 2019-05-31 LAB — AMYLASE: Amylase: 60 U/L (ref 31–110)

## 2019-05-31 LAB — LIPASE: Lipase: 24 U/L (ref 14–72)

## 2019-06-02 ENCOUNTER — Telehealth: Payer: Self-pay

## 2019-06-02 NOTE — Telephone Encounter (Signed)
Lauren Felts, FNP  [name], CMA  You do not have any allergies to any foods. Kidney and liver functions are normal. Total cholesterol is right on the border of 200 goal is less than 199 and LDL is 115 goal is less than 99 limit intake of fried and fatty foods. Your HgbA1c is normal, no diabetes or prediabetes. No abnormal findings with your pancreas or gallbladder. Blood levels are normal.    Gave pt provider message

## 2019-06-08 ENCOUNTER — Encounter: Payer: Self-pay | Admitting: Nurse Practitioner

## 2019-06-16 ENCOUNTER — Encounter: Payer: Self-pay | Admitting: Nurse Practitioner

## 2020-06-17 ENCOUNTER — Encounter: Payer: Self-pay | Admitting: Nurse Practitioner

## 2020-06-17 ENCOUNTER — Other Ambulatory Visit: Payer: Self-pay

## 2020-06-17 ENCOUNTER — Ambulatory Visit (INDEPENDENT_AMBULATORY_CARE_PROVIDER_SITE_OTHER): Payer: BC Managed Care – PPO | Admitting: Nurse Practitioner

## 2020-06-17 VITALS — BP 120/62 | HR 72 | Temp 97.9°F | Ht 62.0 in | Wt 136.8 lb

## 2020-06-17 DIAGNOSIS — R5383 Other fatigue: Secondary | ICD-10-CM | POA: Diagnosis not present

## 2020-06-17 DIAGNOSIS — R82998 Other abnormal findings in urine: Secondary | ICD-10-CM | POA: Diagnosis not present

## 2020-06-17 DIAGNOSIS — Z0000 Encounter for general adult medical examination without abnormal findings: Secondary | ICD-10-CM

## 2020-06-17 DIAGNOSIS — Z1159 Encounter for screening for other viral diseases: Secondary | ICD-10-CM

## 2020-06-17 DIAGNOSIS — R200 Anesthesia of skin: Secondary | ICD-10-CM | POA: Diagnosis not present

## 2020-06-17 DIAGNOSIS — R109 Unspecified abdominal pain: Secondary | ICD-10-CM

## 2020-06-17 DIAGNOSIS — Z13228 Encounter for screening for other metabolic disorders: Secondary | ICD-10-CM

## 2020-06-17 LAB — POCT URINALYSIS DIPSTICK
Bilirubin, UA: NEGATIVE
Glucose, UA: NEGATIVE
Ketones, UA: NEGATIVE
Nitrite, UA: NEGATIVE
Protein, UA: NEGATIVE
Spec Grav, UA: 1.025 (ref 1.010–1.025)
Urobilinogen, UA: 0.2 E.U./dL
pH, UA: 7 (ref 5.0–8.0)

## 2020-06-17 NOTE — Progress Notes (Signed)
I,Tianna Badgett,acting as a Education administrator for [employer], NP.,have documented all relevant documentation on the behalf of [employer], NP,as directed by  Bary Castilla, NP while in the presence of Bary Castilla, NP.  This visit occurred during the SARS-CoV-2 public health emergency.  Safety protocols were in place, including screening questions prior to the visit, additional usage of staff PPE, and extensive cleaning of exam room while observing appropriate contact time as indicated for disinfecting solutions.  Subjective:     Patient ID: Lauren Duarte , female    DOB: 02-25-1976 , 45 y.o.   MRN: [id number]   No chief complaint on file.   HPI  Patient is here for full physical exam. She is followed by Dr Charlesetta Garibaldi for her GYN care. Has scheduled to get her paps smear and mammogram with them. She has complaints of lower back pain and arm numbness at times mostly when she wakes up.    LMP: 05/29/2020 She has IUD. Paraguard.  She does not smoke. She drinks occasionally. 5-8 drinks total in a week.  Married. One son 48 years old.  Teacher for grade [employer]   Back Pain This is a recurrent problem. The current episode started more than 1 month ago. The problem occurs intermittently. The problem is unchanged. The pain is present in the sacro-iliac. The quality of the pain is described as aching. The pain is at a severity of 3/10. The pain is mild. The pain is the same all the time. The symptoms are aggravated by standing. Associated symptoms include abdominal pain. Pertinent negatives include no chest pain, dysuria, fever or headaches.     Past Medical History:  Diagnosis Date  . Abnormal Pap smear 06/18/07   ASCUS  . Breast nodule    left breast  . BV (bacterial vaginosis)   . HSV-1 (herpes simplex virus 1) infection   . Pelvic pain in female   . UTI (urinary tract infection)      Family History  Problem Relation Age of Onset  . Thyroid disease Mother   . Healthy Father   .  Cancer Sister   . Cancer Maternal Grandmother   . Alzheimer's disease Maternal Grandfather   . Diabetes Paternal Grandmother      Current Outpatient Medications:  .  IUD's (PARAGARD INTRAUTERINE COPPER IU), by Intrauterine route., Disp: , Rfl:  .  Multiple Vitamins-Minerals (MULTIVITAMINS THER. W/MINERALS) TABS, Take 1 tablet by mouth daily., Disp: , Rfl:    Allergies  Allergen Reactions  . Penicillins Itching and Swelling      The patient states she uses IUD ParaGard or birth control. Last LMP was 05/29/2020 Negative for: breast discharge, breast lump(s), breast pain and breast self exam. Associated symptoms include abnormal vaginal bleeding. Pertinent negatives include abnormal bleeding (hematology), anxiety, decreased libido, depression, difficulty falling sleep, dyspareunia, history of infertility, nocturia, sexual dysfunction, sleep disturbances, urinary incontinence, urinary urgency, vaginal discharge and vaginal itching. Diet regular.The patient states her exercise level is    . The patient's tobacco use is: none  Social History   Tobacco Use  Smoking Status Former Smoker  Smokeless Tobacco Never Used  . She has been exposed to passive smoke. The patient's alcohol use is: occasional  Social History   Substance and Sexual Activity  Alcohol Use Yes  . Additional information: Last pap 2019, next one scheduled for 2022   Review of Systems  Constitutional: Negative.  Negative for chills and fever.  HENT: Negative.  Negative for congestion, ear pain and  sore throat.   Eyes: Negative.   Respiratory: Negative.  Negative for cough, choking, chest tightness, shortness of breath and wheezing.   Cardiovascular: Negative.  Negative for chest pain and palpitations.  Gastrointestinal: Positive for abdominal pain. Negative for constipation and diarrhea.  Endocrine: Negative.  Negative for cold intolerance and heat intolerance.  Genitourinary: Positive for flank pain. Negative for  difficulty urinating, dysuria and urgency.  Musculoskeletal: Positive for back pain.  Skin: Negative.   Allergic/Immunologic: Negative.   Neurological: Negative.  Negative for headaches.  Hematological: Negative.   Psychiatric/Behavioral: Negative.      Today's Vitals   06/17/20 1044  BP: 120/62  Pulse: 72  Temp: 97.9 F (36.6 C)  TempSrc: Oral  Weight: 136 lb 12.8 oz (62.1 kg)  Height: '5\' 2"'  (1.575 m)   Body mass index is 25.02 kg/m.  Wt Readings from Last 3 Encounters:  06/17/20 136 lb 12.8 oz (62.1 kg)  05/29/19 136 lb 12.8 oz (62.1 kg)  01/06/17 120 lb (54.4 kg)    Objective:  Physical Exam Vitals reviewed.  Constitutional:      General: She is not in acute distress.    Appearance: Normal appearance. She is not ill-appearing.  HENT:     Head: Normocephalic and atraumatic.     Right Ear: Tympanic membrane, ear canal and external ear normal. There is no impacted cerumen.     Left Ear: Tympanic membrane, ear canal and external ear normal. There is no impacted cerumen.     Nose:     Comments: Deferred masked     Mouth/Throat:     Comments: Deferred. Masked  Eyes:     Extraocular Movements: Extraocular movements intact.     Conjunctiva/sclera: Conjunctivae normal.     Pupils: Pupils are equal, round, and reactive to light.  Cardiovascular:     Rate and Rhythm: Normal rate and regular rhythm.     Pulses: Normal pulses.     Heart sounds: Normal heart sounds. No murmur heard.   Pulmonary:     Effort: Pulmonary effort is normal.     Breath sounds: Normal breath sounds. No wheezing or rhonchi.  Chest:  Breasts:     Tanner Score is 5.     Right: No supraclavicular adenopathy.     Left: No supraclavicular adenopathy.    Abdominal:     General: Abdomen is flat. Bowel sounds are normal.     Palpations: Abdomen is soft.  Genitourinary:    Comments: Deferred. Patient sees a OBGYN  Musculoskeletal:        General: Tenderness present. No swelling. Normal range of  motion.     Cervical back: Normal range of motion and neck supple.     Comments: CVA tenderness to bilateral lower back   Lymphadenopathy:     Cervical: No cervical adenopathy.     Upper Body:     Right upper body: No supraclavicular adenopathy.     Left upper body: No supraclavicular adenopathy.  Skin:    General: Skin is warm and dry.     Capillary Refill: Capillary refill takes less than 2 seconds.  Neurological:     Mental Status: She is alert and oriented to person, place, and time.     Motor: No weakness.  Psychiatric:        Mood and Affect: Mood normal.        Behavior: Behavior normal.        Thought Content: Thought content normal.  Judgment: Judgment normal.        Assessment And Plan:     1. Health maintenance examination - POCT Urinalysis Dipstick (79480) -Behavior modifications discussed and diet history reviewed.   -Pt will continue to exercise regularly and modify diet with low GI, plant based foods and decrease intake of processed foods.  -Recommend intake of daily multivitamin, Vitamin D, and calcium.  -Recommend mammogram for preventive screenings, as well as recommend immunizations that include influenza, TDAP, COVID 19 vaccines   2. Encounter for screening for metabolic disorder - CBC - Hemoglobin A1c - CMP14+EGFR - Lipid panel - Vitamin B12  3. Fatigue, unspecified type - Vitamin B12 - TSH + free T4  4. Left arm numbness  -Numbness only in the morning upon waking up. Will work her up for vitamin B12 deficiency.  -Vitamin B12  -Hmg A1c    5. Bilateral flank pain - CBC - POCT Urinalysis Dipstick (16553) - Urine Culture -Will check for UTI with urine culture and treat if needed.   6. Urine white blood cells increased - Urine Culture  7. Encounter for hepatitis C screening test for low risk patient - Hepatitis C antibody  Staying healthy and adopting a healthy lifestyle for your overall health is important. You should eat 7 or more  servings of fruits and vegetables per day. You should drink plenty of water to keep yourself hydrated and your kidneys healthy. This includes about 65-80+ fluid ounces of water. Limit your intake of animal fats especially for elevated cholesterol. Avoid highly processed food and limit your salt intake if you have hypertension. Avoid foods high in saturated/Trans fats. Along with a healthy diet it is also very important to maintain time for yourself to maintain a healthy mental health with low stress levels. You should get atleast 150 min of moderate intensity exercise weekly for a healthy heart. Along with eating right and exercising, aim for at least 7-9 hours of sleep daily.  Eat more whole grains which includes barley, wheat berries, oats, brown rice and whole wheat pasta. Use healthy plant oils which include olive, soy, corn, sunflower and peanut. Limit your caffeine and sugary drinks. Limit your intake of fast foods. Limit milk and dairy products to one or two daily servings.   Patient was given opportunity to ask questions. Patient verbalized understanding of the plan and was able to repeat key elements of the plan. All questions were answered to their satisfaction.   Bary Castilla, NP   I, Bary Castilla, NP, have reviewed all documentation for this visit. The documentation on 06/17/20 for the exam, diagnosis, procedures, and orders are all accurate and complete.  THE PATIENT IS ENCOURAGED TO PRACTICE SOCIAL DISTANCING DUE TO THE COVID-19 PANDEMIC.

## 2020-06-17 NOTE — Patient Instructions (Signed)
Health Maintenance, Female Adopting a healthy lifestyle and getting preventive care are important in promoting health and wellness. Ask your health care provider about:  The right schedule for you to have regular tests and exams.  Things you can do on your own to prevent diseases and keep yourself healthy. What should I know about diet, weight, and exercise? Eat a healthy diet  Eat a diet that includes plenty of vegetables, fruits, low-fat dairy products, and lean protein.  Do not eat a lot of foods that are high in solid fats, added sugars, or sodium.   Maintain a healthy weight Body mass index (BMI) is used to identify weight problems. It estimates body fat based on height and weight. Your health care provider can help determine your BMI and help you achieve or maintain a healthy weight. Get regular exercise Get regular exercise. This is one of the most important things you can do for your health. Most adults should:  Exercise for at least 150 minutes each week. The exercise should increase your heart rate and make you sweat (moderate-intensity exercise).  Do strengthening exercises at least twice a week. This is in addition to the moderate-intensity exercise.  Spend less time sitting. Even light physical activity can be beneficial. Watch cholesterol and blood lipids Have your blood tested for lipids and cholesterol at 45 years of age, then have this test every 5 years. Have your cholesterol levels checked more often if:  Your lipid or cholesterol levels are high.  You are older than 45 years of age.  You are at high risk for heart disease. What should I know about cancer screening? Depending on your health history and family history, you may need to have cancer screening at various ages. This may include screening for:  Breast cancer.  Cervical cancer.  Colorectal cancer.  Skin cancer.  Lung cancer. What should I know about heart disease, diabetes, and high blood  pressure? Blood pressure and heart disease  High blood pressure causes heart disease and increases the risk of stroke. This is more likely to develop in people who have high blood pressure readings, are of African descent, or are overweight.  Have your blood pressure checked: ? Every 3-5 years if you are 18-39 years of age. ? Every year if you are 40 years old or older. Diabetes Have regular diabetes screenings. This checks your fasting blood sugar level. Have the screening done:  Once every three years after age 40 if you are at a normal weight and have a low risk for diabetes.  More often and at a younger age if you are overweight or have a high risk for diabetes. What should I know about preventing infection? Hepatitis B If you have a higher risk for hepatitis B, you should be screened for this virus. Talk with your health care provider to find out if you are at risk for hepatitis B infection. Hepatitis C Testing is recommended for:  Everyone born from 1945 through 1965.  Anyone with known risk factors for hepatitis C. Sexually transmitted infections (STIs)  Get screened for STIs, including gonorrhea and chlamydia, if: ? You are sexually active and are younger than 45 years of age. ? You are older than 45 years of age and your health care provider tells you that you are at risk for this type of infection. ? Your sexual activity has changed since you were last screened, and you are at increased risk for chlamydia or gonorrhea. Ask your health care provider   if you are at risk.  Ask your health care provider about whether you are at high risk for HIV. Your health care provider may recommend a prescription medicine to help prevent HIV infection. If you choose to take medicine to prevent HIV, you should first get tested for HIV. You should then be tested every 3 months for as long as you are taking the medicine. Pregnancy  If you are about to stop having your period (premenopausal) and  you may become pregnant, seek counseling before you get pregnant.  Take 400 to 800 micrograms (mcg) of folic acid every day if you become pregnant.  Ask for birth control (contraception) if you want to prevent pregnancy. Osteoporosis and menopause Osteoporosis is a disease in which the bones lose minerals and strength with aging. This can result in bone fractures. If you are 65 years old or older, or if you are at risk for osteoporosis and fractures, ask your health care provider if you should:  Be screened for bone loss.  Take a calcium or vitamin D supplement to lower your risk of fractures.  Be given hormone replacement therapy (HRT) to treat symptoms of menopause. Follow these instructions at home: Lifestyle  Do not use any products that contain nicotine or tobacco, such as cigarettes, e-cigarettes, and chewing tobacco. If you need help quitting, ask your health care provider.  Do not use street drugs.  Do not share needles.  Ask your health care provider for help if you need support or information about quitting drugs. Alcohol use  Do not drink alcohol if: ? Your health care provider tells you not to drink. ? You are pregnant, may be pregnant, or are planning to become pregnant.  If you drink alcohol: ? Limit how much you use to 0-1 drink a day. ? Limit intake if you are breastfeeding.  Be aware of how much alcohol is in your drink. In the [place] one drink equals one 12 oz bottle of beer (355 mL), one 5 oz glass of wine (148 mL), or one 1 oz glass of hard liquor (44 mL). General instructions  Schedule regular health, dental, and eye exams.  Stay current with your vaccines.  Tell your health care provider if: ? You often feel depressed. ? You have ever been abused or do not feel safe at home. Summary  Adopting a healthy lifestyle and getting preventive care are important in promoting health and wellness.  Follow your health care provider's instructions about healthy  diet, exercising, and getting tested or screened for diseases.  Follow your health care provider's instructions on monitoring your cholesterol and blood pressure. This information is not intended to replace advice given to you by your health care provider. Make sure you discuss any questions you have with your health care provider. Document Revised: 04/03/2018 Document Reviewed: 04/03/2018 Elsevier Patient Education  2021 [employer]  

## 2020-06-19 LAB — HEMOGLOBIN A1C
Est. average glucose Bld gHb Est-mCnc: 103 mg/dL
Hgb A1c MFr Bld: 5.2 % (ref 4.8–5.6)

## 2020-06-19 LAB — TSH+FREE T4
Free T4: 1.29 ng/dL (ref 0.82–1.77)
TSH: 1.92 u[IU]/mL (ref 0.450–4.500)

## 2020-06-19 LAB — CBC
Hematocrit: 45.4 % (ref 34.0–46.6)
Hemoglobin: 15.2 g/dL (ref 11.1–15.9)
MCH: 30.6 pg (ref 26.6–33.0)
MCHC: 33.5 g/dL (ref 31.5–35.7)
MCV: 91 fL (ref 79–97)
Platelets: 308 10*3/uL (ref 150–450)
RBC: 4.97 x10E6/uL (ref 3.77–5.28)
RDW: 12.9 % (ref 11.7–15.4)
WBC: 6.4 10*3/uL (ref 3.4–10.8)

## 2020-06-19 LAB — CMP14+EGFR
ALT: 22 IU/L (ref 0–32)
AST: 25 IU/L (ref 0–40)
Albumin/Globulin Ratio: 1.8 (ref 1.2–2.2)
Albumin: 4.7 g/dL (ref 3.8–4.8)
Alkaline Phosphatase: 75 IU/L (ref 44–121)
BUN/Creatinine Ratio: 18 (ref 9–23)
BUN: 13 mg/dL (ref 6–24)
Bilirubin Total: 0.5 mg/dL (ref 0.0–1.2)
CO2: 20 mmol/L (ref 20–29)
Calcium: 9.6 mg/dL (ref 8.7–10.2)
Chloride: 100 mmol/L (ref 96–106)
Creatinine, Ser: 0.71 mg/dL (ref 0.57–1.00)
GFR calc Af Amer: 119 mL/min/{1.73_m2} (ref >59)
GFR calc non Af Amer: 103 mL/min/{1.73_m2} (ref >59)
Globulin, Total: 2.6 g/dL (ref 1.5–4.5)
Glucose: 86 mg/dL (ref 65–99)
Potassium: 4.1 mmol/L (ref 3.5–5.2)
Sodium: 137 mmol/L (ref 134–144)
Total Protein: 7.3 g/dL (ref 6.0–8.5)

## 2020-06-19 LAB — HEPATITIS C ANTIBODY: Hep C Virus Ab: <0.1 s/co ratio (ref 0.0–0.9)

## 2020-06-19 LAB — VITAMIN B12: Vitamin B-12: 498 pg/mL (ref 232–1245)

## 2020-06-19 LAB — LIPID PANEL
Chol/HDL Ratio: 3.2 ratio (ref 0.0–4.4)
Cholesterol, Total: 204 mg/dL — ABNORMAL HIGH (ref 100–199)
HDL: 63 mg/dL (ref >39)
LDL Chol Calc (NIH): 125 mg/dL — ABNORMAL HIGH (ref 0–99)
Triglycerides: 90 mg/dL (ref 0–149)
VLDL Cholesterol Cal: 16 mg/dL (ref 5–40)

## 2020-06-20 LAB — URINE CULTURE

## 2020-09-02 IMAGING — CT CT ANGIOGRAPHY CHEST
1 of 9 series · 3 of 16 positions shown · IV contrast (omnipaque)
Comparison: Chest radiograph October 03, 2018

CLINICAL DATA: Chest pain

EXAM:
CT ANGIOGRAPHY CHEST WITH CONTRAST
TECHNIQUE: Multidetector CT imaging of the chest was performed using the
standard protocol during bolus administration of intravenous
contrast. Multiplanar CT image reconstructions and MIPs were
obtained to evaluate the vascular anatomy.
CONTRAST:  100mL OMNIPAQUE IOHEXOL 350 MG/ML SOLN

[Series 11: pe thins · axial · 0.62mm/px · z∈[-224,-94]mm · 3 of 262 slices shown]
[im 66/262  lung]
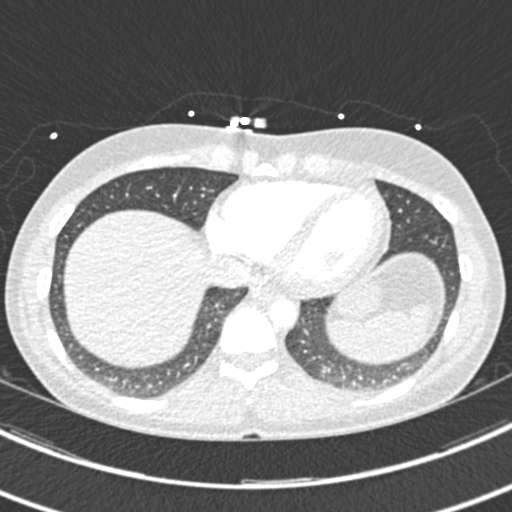
[im 131/262  soft-tissue]
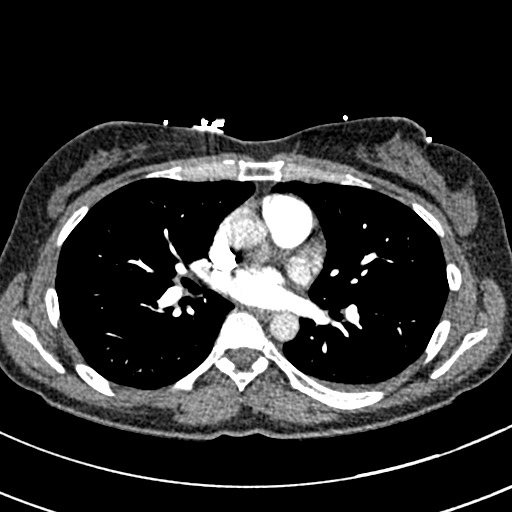
[im 196/262  lung]
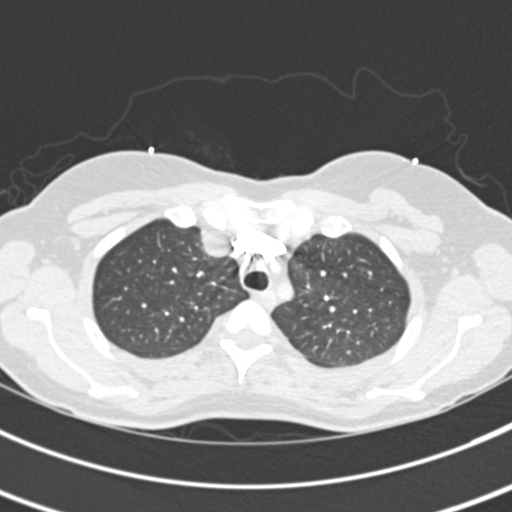

[3 of 16 positions shown; findings below may reference images not displayed]

FINDINGS: Cardiovascular: There is no demonstrable pulmonary embolus. There is
no thoracic aortic aneurysm or dissection. Visualized great vessels
appear unremarkable. There is no pericardial effusion or pericardial
thickening evident.

Mediastinum/Nodes: There is generalized enlargement of the right
lobe of the thyroid compared to the left. No well-defined thyroid
mass evident. There is no appreciable thoracic adenopathy. No
esophageal lesions are evident.

Lungs/Pleura: There are scattered areas of atelectatic change. There
is no frank edema or consolidation. There is a small left pleural
effusion.

Upper Abdomen: There is an apparent accessory spleen posterior to
the left kidney. Visualized upper abdominal structures otherwise
appear unremarkable.

Musculoskeletal: There are no blastic or lytic bone lesions. No
chest wall lesions are evident.

Review of the MIP images confirms the above findings.
IMPRESSION: 1. No demonstrable pulmonary embolus. No thoracic aortic aneurysm or
dissection.

2. Scattered areas of atelectatic change. No edema or consolidation.
There is a small left pleural effusion.

3. Relative enlargement of the right lobe of the thyroid compared to
the left. No focal thyroid mass evident. Significance of this
thyroid lobe asymmetry uncertain.

4.  No evident thoracic adenopathy.

## 2021-01-06 ENCOUNTER — Other Ambulatory Visit: Payer: Self-pay

## 2021-01-06 ENCOUNTER — Ambulatory Visit: Payer: BC Managed Care – PPO | Admitting: Podiatry

## 2021-01-06 ENCOUNTER — Ambulatory Visit (INDEPENDENT_AMBULATORY_CARE_PROVIDER_SITE_OTHER): Payer: BC Managed Care – PPO

## 2021-01-06 ENCOUNTER — Encounter: Payer: Self-pay | Admitting: Podiatry

## 2021-01-06 DIAGNOSIS — M722 Plantar fascial fibromatosis: Secondary | ICD-10-CM

## 2021-01-06 MED ORDER — TRIAMCINOLONE ACETONIDE 10 MG/ML IJ SUSP
10.0000 mg | Freq: Once | INTRAMUSCULAR | Status: AC
  Administered 2021-01-06: 10 mg

## 2021-01-06 MED ORDER — DICLOFENAC SODIUM 75 MG PO TBEC: 75.0000 mg | DELAYED_RELEASE_TABLET | Freq: Two times a day (BID) | ORAL | 2 refills | Status: DC

## 2021-01-06 NOTE — Patient Instructions (Signed)

## 2021-01-07 NOTE — Progress Notes (Signed)
Subjective:   Patient ID: Lauren Duarte, female   DOB: 45 y.o.   MRN: [id number]   HPI Patient states she is had a lot of pain in the bottom of the right heel for around 5 months and it is gradually become more of an issue over that time.  She then went on vacation and it got worse and patient does not smoke likes to be active   Review of Systems  All other systems reviewed and are negative.      Objective:  Physical Exam Vitals and nursing note reviewed.  Constitutional:      Appearance: She is well-developed.  Pulmonary:     Effort: Pulmonary effort is normal.  Musculoskeletal:        General: Normal range of motion.  Skin:    General: Skin is warm.  Neurological:     Mental Status: She is alert.    Neurovascular status intact muscle strength adequate range of motion within normal limits with exquisite discomfort plantar aspect right heel 34-month duration with fluid buildup around the medial band.  Patient is found to have good digital perfusion well oriented x3 with moderate depression of the arch mild equinus     Assessment:  Acute Planter fasciitis right with inflammation fluid buildup     Plan:  H&P reviewed condition sterile prep and injected the plantar fascial right 3 mg Kenalog 5 mg Xylocaine discussed possibility for orthotic therapy reviewing this with her and applied fascial brace.  Placed on diclofenac 75 mg twice daily reappoint for [place] to recheck  X-rays indicate small spur no indication stress fracture arthritis

## 2021-01-13 ENCOUNTER — Ambulatory Visit: Payer: BC Managed Care – PPO | Admitting: Podiatry

## 2021-06-21 ENCOUNTER — Encounter: Payer: Self-pay | Admitting: Nurse Practitioner

## 2021-06-21 NOTE — Progress Notes (Signed)
[facility] error  ?

## 2021-06-21 NOTE — Patient Instructions (Signed)

## 2021-12-07 ENCOUNTER — Ambulatory Visit (INDEPENDENT_AMBULATORY_CARE_PROVIDER_SITE_OTHER): Payer: BC Managed Care – PPO | Admitting: Nurse Practitioner

## 2021-12-07 ENCOUNTER — Encounter: Payer: Self-pay | Admitting: Nurse Practitioner

## 2021-12-07 VITALS — BP 116/60 | HR 69 | Temp 98.3°F | Ht 62.0 in | Wt 141.0 lb

## 2021-12-07 DIAGNOSIS — B009 Herpesviral infection, unspecified: Secondary | ICD-10-CM | POA: Diagnosis not present

## 2021-12-07 DIAGNOSIS — Z13228 Encounter for screening for other metabolic disorders: Secondary | ICD-10-CM

## 2021-12-07 DIAGNOSIS — Z0000 Encounter for general adult medical examination without abnormal findings: Secondary | ICD-10-CM

## 2021-12-07 DIAGNOSIS — R3915 Urgency of urination: Secondary | ICD-10-CM | POA: Diagnosis not present

## 2021-12-07 DIAGNOSIS — Z803 Family history of malignant neoplasm of breast: Secondary | ICD-10-CM

## 2021-12-07 DIAGNOSIS — R200 Anesthesia of skin: Secondary | ICD-10-CM

## 2021-12-07 DIAGNOSIS — R21 Rash and other nonspecific skin eruption: Secondary | ICD-10-CM | POA: Diagnosis not present

## 2021-12-07 DIAGNOSIS — Z0001 Encounter for general adult medical examination with abnormal findings: Secondary | ICD-10-CM | POA: Diagnosis not present

## 2021-12-07 LAB — POCT URINALYSIS DIPSTICK
Bilirubin, UA: NEGATIVE
Glucose, UA: NEGATIVE
Leukocytes, UA: NEGATIVE
Nitrite, UA: NEGATIVE
Protein, UA: NEGATIVE
Spec Grav, UA: >=1.03 — AB (ref 1.010–1.025)
Urobilinogen, UA: 0.2 E.U./dL
pH, UA: 5.5 (ref 5.0–8.0)

## 2021-12-07 MED ORDER — VALACYCLOVIR HCL 500 MG PO TABS: 500.0000 mg | ORAL_TABLET | Freq: Every day | ORAL | 0 refills | Status: DC

## 2021-12-07 MED ORDER — VALACYCLOVIR HCL 500 MG PO TABS: 500.0000 mg | ORAL_TABLET | Freq: Every day | ORAL | 0 refills | Status: AC

## 2021-12-07 NOTE — Patient Instructions (Signed)

## 2021-12-07 NOTE — Progress Notes (Signed)
I,Tianna Badgett,acting as a Education administrator for [employer], FNP.,have documented all relevant documentation on the behalf of Minette Brine, FNP,as directed by  Minette Brine, FNP while in the presence of Minette Brine, [place]. Subjective:     Patient ID: Lauren Duarte , female    DOB: 1975/09/20 , 46 y.o.   MRN: [id number]   Chief Complaint  Patient presents with   Annual Exam    HPI  Patient is here for full physical exam. She is followed by Dr Charlesetta Garibaldi for her GYN care. Request faxed for record for PAP and MAMMO. She has seen Dr. Collene Mares in the past for colonoscopy, but is now having problems with hemorrhoids once a month.     Past Medical History:  Diagnosis Date   Abnormal Pap smear 06/18/07   ASCUS   Breast nodule    left breast   BV (bacterial vaginosis)    HSV-1 (herpes simplex virus 1) infection    Pelvic pain in female    UTI (urinary tract infection)      Family History  Problem Relation Age of Onset   Thyroid disease Mother    Healthy Father    Cancer Sister    Cancer Maternal Grandmother    Alzheimer's disease Maternal Grandfather    Diabetes Paternal Grandmother      Current Outpatient Medications:    IUD's (PARAGARD INTRAUTERINE COPPER IU), by Intrauterine route., Disp: , Rfl:    Multiple Vitamins-Minerals (MULTIVITAMINS THER. W/MINERALS) TABS, Take 1 tablet by mouth daily., Disp: , Rfl:    valACYclovir (VALTREX) 500 MG tablet, Take 1 tablet (500 mg total) by mouth daily., Disp: 30 tablet, Rfl: 0   Allergies  Allergen Reactions   Penicillins Itching and Swelling      The patient states she uses IUD for birth control.  No LMP recorded. (Menstrual status: IUD).. Negative for Dysmenorrhea and Negative for Menorrhagia. Negative for: breast discharge, breast lump(s), breast pain and breast self exam. Associated symptoms include abnormal vaginal bleeding. Pertinent negatives include abnormal bleeding (hematology), anxiety, decreased libido, depression, difficulty falling  sleep, dyspareunia, history of infertility, nocturia, sexual dysfunction, sleep disturbances, urinary incontinence, urinary urgency, vaginal discharge and vaginal itching. Diet regular; tries to eat healthy.  The patient states her exercise level is moderate.   The patient's tobacco use is:  Social History   Tobacco Use  Smoking Status Former  Smokeless Tobacco Never  . She has been exposed to passive smoke. The patient's alcohol use is:  Social History   Substance and Sexual Activity  Alcohol Use Yes    Review of Systems  Constitutional: Negative.   HENT: Negative.    Eyes: Negative.   Respiratory: Negative.    Cardiovascular: Negative.   Gastrointestinal: Negative.   Endocrine: Negative.   Genitourinary:  Positive for urgency.  Musculoskeletal:  Negative for back pain.  Skin: Negative.   Allergic/Immunologic: Negative.   Neurological:  Positive for numbness (hand numbness).  Hematological: Negative.   Psychiatric/Behavioral: Negative.       Today's Vitals   12/07/21 1609  BP: 116/60  Pulse: 69  Temp: 98.3 F (36.8 C)  TempSrc: Oral  Weight: 141 lb (64 kg)  Height: '5\' 2"'  (1.575 m)   Body mass index is 25.79 kg/m.  Wt Readings from Last 3 Encounters:  12/07/21 141 lb (64 kg)  06/17/20 136 lb 12.8 oz (62.1 kg)  05/29/19 136 lb 12.8 oz (62.1 kg)    Objective:  Physical Exam Vitals reviewed.  Constitutional:  General: She is not in acute distress.    Appearance: Normal appearance. She is well-developed. She is obese.  HENT:     Head: Normocephalic and atraumatic.     Right Ear: Hearing, tympanic membrane, ear canal and external ear normal. There is no impacted cerumen.     Left Ear: Hearing, tympanic membrane, ear canal and external ear normal. There is no impacted cerumen.     Nose: Nose normal.     Mouth/Throat:     Mouth: Mucous membranes are moist.  Eyes:     General: Lids are normal.     Extraocular Movements: Extraocular movements intact.      Conjunctiva/sclera: Conjunctivae normal.     Pupils: Pupils are equal, round, and reactive to light.     Funduscopic exam:    Right eye: No papilledema.        Left eye: No papilledema.  Neck:     Thyroid: No thyroid mass.     Vascular: No carotid bruit.  Cardiovascular:     Rate and Rhythm: Normal rate and regular rhythm.     Pulses: Normal pulses.     Heart sounds: Normal heart sounds. No murmur heard. Pulmonary:     Effort: Pulmonary effort is normal. No respiratory distress.     Breath sounds: Normal breath sounds. No wheezing.  Chest:     Chest wall: No mass.  Breasts:    Tanner Score is 5.     Right: Normal. No mass or tenderness.     Left: Normal. No mass or tenderness.  Abdominal:     General: Abdomen is flat. Bowel sounds are normal. There is no distension.     Palpations: Abdomen is soft.     Tenderness: There is no abdominal tenderness.  Genitourinary:    Comments: Has papular rash to near her anal area Musculoskeletal:        General: No swelling or tenderness. Normal range of motion.     Cervical back: Full passive range of motion without pain, normal range of motion and neck supple.     Right lower leg: No edema.     Left lower leg: No edema.  Lymphadenopathy:     Upper Body:     Right upper body: No supraclavicular, axillary or pectoral adenopathy.     Left upper body: No supraclavicular, axillary or pectoral adenopathy.  Skin:    General: Skin is warm and dry.     Capillary Refill: Capillary refill takes less than 2 seconds.     Findings: Rash (small papular rash to area around rectum) present.  Neurological:     General: No focal deficit present.     Mental Status: She is alert and oriented to person, place, and time.     Cranial Nerves: No cranial nerve deficit.     Sensory: No sensory deficit.     Motor: No weakness.  Psychiatric:        Mood and Affect: Mood normal.        Behavior: Behavior normal.        Thought Content: Thought content normal.         Judgment: Judgment normal.         Assessment And Plan:     1. Health maintenance examination Behavior modifications discussed and diet history reviewed.   Pt will continue to exercise regularly and modify diet with low GI, plant based foods and decrease intake of processed foods.  Recommend intake of daily multivitamin, Vitamin  D, and calcium.  Recommend mammogram and colonoscopy for preventive screenings, as well as recommend immunizations that include influenza, TDAP - CBC  2. Encounter for screening for metabolic disorder - BWL89+HTDS - Lipid panel  3. Family history of breast cancer in first degree relative Comments: Sister had breast cancer and would like to be screened for any genetic correlation, will refer to genetics  - Ambulatory referral to Genetics  4. Urinary urgency - POCT Urinalysis Dipstick (81002)  5. HSV-1 (herpes simplex virus 1) infection Being managed by Dr. Charlesetta Garibaldi - valACYclovir (VALTREX) 500 MG tablet; Take 1 tablet (500 mg total) by mouth daily.  Dispense: 30 tablet; Refill: 0  6. Bilateral hand numbness Comments: Encouraged to wear hand splints with thumb at night, if not better return call to office may need a referral to hand specialist  7. Rash and nonspecific skin eruption Comments: Papular rash near anal area, advised to take her valtrex to see if any improvement, if not better return call to office   Patient was given opportunity to ask questions. Patient verbalized understanding of the plan and was able to repeat key elements of the plan. All questions were answered to their satisfaction.   Minette Brine, FNP   I, Minette Brine, FNP, have reviewed all documentation for this visit. The documentation on 12/07/21 for the exam, diagnosis, procedures, and orders are all accurate and complete.   THE PATIENT IS ENCOURAGED TO PRACTICE SOCIAL DISTANCING DUE TO THE COVID-19 PANDEMIC.

## 2021-12-08 LAB — LIPID PANEL
Chol/HDL Ratio: 3.7 ratio (ref 0.0–4.4)
Cholesterol, Total: 261 mg/dL — ABNORMAL HIGH (ref 100–199)
HDL: 71 mg/dL (ref >39)
LDL Chol Calc (NIH): 174 mg/dL — ABNORMAL HIGH (ref 0–99)
Triglycerides: 94 mg/dL (ref 0–149)
VLDL Cholesterol Cal: 16 mg/dL (ref 5–40)

## 2021-12-08 LAB — CMP14+EGFR
ALT: 24 IU/L (ref 0–32)
AST: 25 IU/L (ref 0–40)
Albumin/Globulin Ratio: 2 (ref 1.2–2.2)
Albumin: 4.8 g/dL (ref 3.9–4.9)
Alkaline Phosphatase: 87 IU/L (ref 44–121)
BUN/Creatinine Ratio: 17 (ref 9–23)
BUN: 12 mg/dL (ref 6–24)
Bilirubin Total: 0.5 mg/dL (ref 0.0–1.2)
CO2: 22 mmol/L (ref 20–29)
Calcium: 9.3 mg/dL (ref 8.7–10.2)
Chloride: 101 mmol/L (ref 96–106)
Creatinine, Ser: 0.69 mg/dL (ref 0.57–1.00)
Globulin, Total: 2.4 g/dL (ref 1.5–4.5)
Glucose: 93 mg/dL (ref 70–99)
Potassium: 4.1 mmol/L (ref 3.5–5.2)
Sodium: 137 mmol/L (ref 134–144)
Total Protein: 7.2 g/dL (ref 6.0–8.5)
eGFR: 108 mL/min/{1.73_m2} (ref >59)

## 2021-12-08 LAB — CBC
Hematocrit: 43 % (ref 34.0–46.6)
Hemoglobin: 14.4 g/dL (ref 11.1–15.9)
MCH: 30.6 pg (ref 26.6–33.0)
MCHC: 33.5 g/dL (ref 31.5–35.7)
MCV: 92 fL (ref 79–97)
Platelets: 278 10*3/uL (ref 150–450)
RBC: 4.7 x10E6/uL (ref 3.77–5.28)
RDW: 13.1 % (ref 11.7–15.4)
WBC: 6.9 10*3/uL (ref 3.4–10.8)

## 2021-12-13 ENCOUNTER — Telehealth: Payer: Self-pay | Admitting: Genetic Counselor

## 2021-12-13 NOTE — Telephone Encounter (Signed)
Scheduled appt per 8/16 referral. Pt is aware of appt date and time. Pt is aware to arrive 15 mins prior to appt time and to bring and updated insurance card. Pt is aware of appt location.   

## 2022-03-23 ENCOUNTER — Inpatient Hospital Stay: Payer: BC Managed Care – PPO

## 2022-03-23 ENCOUNTER — Inpatient Hospital Stay: Payer: BC Managed Care – PPO | Admitting: Genetic Counselor

## 2022-03-23 DIAGNOSIS — Z803 Family history of malignant neoplasm of breast: Secondary | ICD-10-CM

## 2022-03-23 DIAGNOSIS — Z8041 Family history of malignant neoplasm of ovary: Secondary | ICD-10-CM | POA: Diagnosis not present

## 2022-03-23 LAB — GENETIC SCREENING ORDER

## 2022-03-24 ENCOUNTER — Encounter: Payer: Self-pay | Admitting: Genetic Counselor

## 2022-03-24 DIAGNOSIS — Z803 Family history of malignant neoplasm of breast: Secondary | ICD-10-CM | POA: Insufficient documentation

## 2022-03-24 DIAGNOSIS — Z8041 Family history of malignant neoplasm of ovary: Secondary | ICD-10-CM | POA: Insufficient documentation

## 2022-03-24 NOTE — Progress Notes (Signed)
REFERRING PROVIDER: Minette Brine, [address] [place],  [place] [address]  PRIMARY PROVIDER:  Minette Brine, FNP  PRIMARY REASON FOR VISIT:  1. Family history of breast cancer   2. Family history of ovarian cancer     HISTORY OF PRESENT ILLNESS:   Lauren Duarte, a 46 y.o. female, was seen for a [facility] cancer genetics consultation at the request of Dr. Laurance Flatten due to a family history of cancer.  Lauren Duarte presents to clinic today to discuss the possibility of a hereditary predisposition to cancer, to discuss genetic testing, and to further clarify her future cancer risks, as well as potential cancer risks for family members.   Lauren Duarte is a 46 y.o. female with no personal history of cancer.    RISK FACTORS:  Menarche was at age 77.  First live birth at age 20.  OCP use for approximately 1 year.  Ovaries intact: yes.  Uterus intact: yes.  Menopausal status: premenopausal.  HRT use: 0 years. Colonoscopy: yes; normal. Mammogram within the last year: mammogram scheduled for 04/12/2022. Any excessive radiation exposure in the past: no  Past Medical History:  Diagnosis Date   Abnormal Pap smear 06/18/07   ASCUS   Breast nodule    left breast   BV (bacterial vaginosis)    HSV-1 (herpes simplex virus 1) infection    Pelvic pain in female    UTI (urinary tract infection)     Past Surgical History:  Procedure Laterality Date   COLPOSCOPY  09/09/07    Social History   Socioeconomic History   Marital status: Married    Spouse name: Not on file   Number of children: Not on file   Years of education: Not on file   Highest education level: Not on file  Occupational History   Not on file  Tobacco Use   Smoking status: Former   Smokeless tobacco: Never  Substance and Sexual Activity   Alcohol use: Yes   Drug use: No   Sexual activity: Not on file  Other Topics Concern   Not on file  Social History Narrative   Not on file   Social Determinants of Health    Financial Resource Strain: Not on file  Food Insecurity: Not on file  Transportation Needs: Not on file  Physical Activity: Not on file  Stress: Not on file  Social Connections: Not on file     FAMILY HISTORY:  We obtained a detailed, 4-generation family history.  Significant diagnoses are listed below: Family History  Problem Relation Age of Onset   Thyroid disease Mother    Healthy Father    Breast cancer Sister 59   Ovarian cancer Maternal Grandmother 77   Alzheimer's disease Maternal Grandfather    Diabetes Paternal Grandmother    Breast cancer Other    Testicular cancer Cousin 43       maternal first cousin        Lauren Duarte has two sisters. One sister was diagnosed with breast cancer at age 48, she is unavailable for genetic testing. Her second sister had negative genetic testing. Lauren Duarte maternal grandmother was diagnosed with ovarian cancer at age 46, she died at age 46. Her maternal great aunt (grandmother's sister) was diagnosed with breast cancer at an unknown age (>50), she is deceased. A maternal first cousin was diagnosed with testicular cancer at age 32, he died at age 3. There is no reported Ashkenazi Jewish ancestry.  GENETIC COUNSELING ASSESSMENT: Ms.  Duarte is a 46 y.o. female with a family history of cancer which is somewhat suggestive of a hereditary predisposition to cancer given her family history of breast and ovarian cancer. We, therefore, discussed and recommended the following at today's visit.   DISCUSSION: We discussed that 5 - 10% of cancer is hereditary, with most cases of breast and ovarian cancer associated with BRCA1/2.  There are other genes that can be associated with hereditary breast and ovarian cancer syndromes.  We discussed that testing is beneficial for several reasons, including knowing about other cancer risks, identifying potential screening and risk-reduction options that may be appropriate, and to understanding if other family  members could be at risk for cancer and allowing them to undergo genetic testing.  We reviewed the characteristics, features and inheritance patterns of hereditary cancer syndromes. We also discussed genetic testing, including the appropriate family members to test, the process of testing, insurance coverage and turn-around-time for results. We discussed the implications of a negative, positive, carrier and/or variant of uncertain significant result. We discussed that negative results would be uninformative given that Lauren Duarte does not have a personal history of cancer. We recommended Lauren Duarte pursue genetic testing for a panel that contains genes associated with breast and ovarian cancer.  Lauren Duarte was offered a common hereditary cancer panel (47 genes) and an expanded pan-cancer panel (77 genes). Lauren Duarte was informed of the benefits and limitations of each panel, including that expanded pan-cancer panels contain several genes that do not have clear management guidelines at this point in time.  We also discussed that as the number of genes included on a panel increases, the chances of variants of uncertain significance increases.  After considering the benefits and limitations of each gene panel, Lauren Duarte elected to have [facility] CancerNext-Expanded Panel.  The CancerNext-Expanded gene panel offered by [facility] and includes sequencing, rearrangement, and RNA analysis for the following 77 genes: AIP, ALK, APC, ATM, AXIN2, BAP1, BARD1, BLM, BMPR1A, BRCA1, BRCA2, BRIP1, CDC73, CDH1, CDK4, CDKN1B, CDKN2A, CHEK2, CTNNA1, DICER1, FANCC, FH, FLCN, GALNT12, KIF1B, LZTR1, MAX, MEN1, MET, MLH1, MSH2, MSH3, MSH6, MUTYH, NBN, NF1, NF2, NTHL1, PALB2, PHOX2B, PMS2, POT1, PRKAR1A, PTCH1, PTEN, RAD51C, RAD51D, RB1, RECQL, RET, SDHA, SDHAF2, SDHB, SDHC, SDHD, SMAD4, SMARCA4, SMARCB1, SMARCE1, STK11, SUFU, TMEM127, TP53, TSC1, TSC2, VHL and XRCC2 (sequencing and deletion/duplication); EGFR, EGLN1, HOXB13, KIT, MITF,  PDGFRA, POLD1, and POLE (sequencing only); EPCAM and GREM1 (deletion/duplication only).    Based on Lauren Duarte's family history of cancer, she meets medical criteria for genetic testing. Despite that she meets criteria, she may still have an out of pocket cost. We discussed that if her out of pocket cost for testing is over $100, the laboratory will call and confirm whether she wants to proceed with testing.  If the out of pocket cost of testing is less than $100 she will be billed by the genetic testing laboratory.   We discussed that some people do not want to undergo genetic testing due to fear of genetic discrimination.  A federal law called the Genetic Information Non-Discrimination Act (GINA) of 2008 helps protect individuals against genetic discrimination based on their genetic test results.  It impacts both health insurance and employment.  With health insurance, it protects against increased premiums, being kicked off insurance or being forced to take a test in order to be insured.  For employment it protects against hiring, firing and promoting decisions based on genetic test results.  GINA does not apply to  those in the [employer], those who work for companies with less than 15 employees, and new life insurance or long-term disability Engineer, structural.  Health status due to a cancer diagnosis is not protected under GINA.  PLAN: After considering the risks, benefits, and limitations, Lauren Duarte provided informed consent to pursue genetic testing and the blood sample was sent to [facility] for analysis of the CancerNext-Expanded. Results should be available within approximately 2-3 weeks' time, at which point they will be disclosed by telephone to Lauren Duarte, as will any additional recommendations warranted by these results. Lauren Duarte will receive a summary of her genetic counseling visit and a copy of her results once available. This information will also be available in Epic.   Lauren Duarte  questions were answered to her satisfaction today. Our contact information was provided should additional questions or concerns arise. Thank you for the referral and allowing [place] to share in the care of your patient.   Lucille Passy, MS, [facility] Genetic Counselor [email or web address] (P) [phone]  The patient was seen for a total of 40 minutes in face-to-face genetic counseling. The patient was seen alone.  Drs. Lindi Adie and/or Burr Medico were available to discuss this case as needed.   _______________________________________________________________________ For Office Staff:  Number of people involved in session: 1 Was an Intern/ student involved with case: no

## 2022-04-13 ENCOUNTER — Telehealth: Payer: Self-pay | Admitting: Genetic Counselor

## 2022-04-13 ENCOUNTER — Encounter: Payer: Self-pay | Admitting: Genetic Counselor

## 2022-04-13 DIAGNOSIS — Z1379 Encounter for other screening for genetic and chromosomal anomalies: Secondary | ICD-10-CM | POA: Insufficient documentation

## 2022-04-13 NOTE — Telephone Encounter (Signed)
I contacted Ms. Hefner to discuss her genetic testing results. No pathogenic variants were identified in the 77 genes analyzed. Detailed clinic note to follow.  The test report has been scanned into EPIC and is located under the Molecular Pathology section of the Results Review tab.  A portion of the result report is included below for reference.   Lalla Brothers, MS, [facility] Genetic Counselor [email or web address] (P) [phone]

## 2022-04-21 ENCOUNTER — Ambulatory Visit: Payer: Self-pay | Admitting: Genetic Counselor

## 2022-04-21 DIAGNOSIS — Z1379 Encounter for other screening for genetic and chromosomal anomalies: Secondary | ICD-10-CM

## 2022-04-21 NOTE — Progress Notes (Signed)
HPI:   Lauren Duarte was previously seen in the [facility] clinic due to a family history of cancer and concerns regarding a hereditary predisposition to cancer. Please refer to our prior cancer genetics clinic note for more information regarding our discussion, assessment and recommendations, at the time. Lauren Duarte recent genetic test results were disclosed to her, as were recommendations warranted by these results. These results and recommendations are discussed in more detail below.  CANCER HISTORY:  Oncology History   No history exists.    FAMILY HISTORY:  We obtained a detailed, 4-generation family history.  Significant diagnoses are listed below:      Family History  Problem Relation Age of Onset   Thyroid disease Mother     Healthy Father     Breast cancer Sister 81   Ovarian cancer Maternal Grandmother 49   Alzheimer's disease Maternal Grandfather     Diabetes Paternal Grandmother     Breast cancer Other     Testicular cancer Cousin 89        maternal first cousin             Lauren Duarte has two sisters. One sister was diagnosed with breast cancer at age 36, she is unavailable for genetic testing. Her second sister had negative genetic testing. Lauren Duarte maternal grandmother was diagnosed with ovarian cancer at age 66, she died at age 18. Her maternal great aunt (grandmother's sister) was diagnosed with breast cancer at an unknown age (>50), she is deceased. A maternal first cousin was diagnosed with testicular cancer at age 8, he died at age 53. There is no reported Ashkenazi Jewish ancestry.  GENETIC TEST RESULTS:  The [facility] CancerNext-Expanded Panel found no pathogenic mutations.  The CancerNext-Expanded gene panel offered by [facility] and includes sequencing, rearrangement, and RNA analysis for the following 77 genes: AIP, ALK, APC, ATM, AXIN2, BAP1, BARD1, BLM, BMPR1A, BRCA1, BRCA2, BRIP1, CDC73, CDH1, CDK4, CDKN1B, CDKN2A, CHEK2, CTNNA1, DICER1,  FANCC, FH, FLCN, GALNT12, KIF1B, LZTR1, MAX, MEN1, MET, MLH1, MSH2, MSH3, MSH6, MUTYH, NBN, NF1, NF2, NTHL1, PALB2, PHOX2B, PMS2, POT1, PRKAR1A, PTCH1, PTEN, RAD51C, RAD51D, RB1, RECQL, RET, SDHA, SDHAF2, SDHB, SDHC, SDHD, SMAD4, SMARCA4, SMARCB1, SMARCE1, STK11, SUFU, TMEM127, TP53, TSC1, TSC2, VHL and XRCC2 (sequencing and deletion/duplication); EGFR, EGLN1, HOXB13, KIT, MITF, PDGFRA, POLD1, and POLE (sequencing only); EPCAM and GREM1 (deletion/duplication only).   The test report has been scanned into EPIC and is located under the Molecular Pathology section of the Results Review tab.  A portion of the result report is included below for reference. Genetic testing reported out on 04/11/2022.       Even though a pathogenic variant was not identified, possible explanations for the cancer in the family may include: There may be no hereditary risk for cancer in the family. The cancers in her family may be due to other genetic or environmental factors. There may be a gene mutation in one of these genes that current testing methods cannot detect, but that chance is small. There could be another gene that has not yet been discovered, or that we have not yet tested, that is responsible for the cancer diagnoses in the family.  It is also possible there is a hereditary cause for the cancer in the family that Lauren Duarte did not inherit.  Therefore, it is important to remain in touch with cancer genetics in the future so that we can continue to offer Lauren Duarte the most up to date genetic testing.  ADDITIONAL GENETIC TESTING:  We discussed with Lauren Duarte that her genetic testing was fairly extensive.  If there are genes identified to increase cancer risk that can be analyzed in the future, we would be happy to discuss and coordinate this testing at that time.    CANCER SCREENING RECOMMENDATIONS:  Lauren Duarte test result is considered negative (normal).  This means that we have not identified a hereditary  cause for her family history of cancer at this time.    An individual's cancer risk and medical management are not determined by genetic test results alone. Overall cancer risk assessment incorporates additional factors, including personal medical history, family history, and any available genetic information that may result in a personalized plan for cancer prevention and surveillance. Therefore, it is recommended she continue to follow the cancer management and screening guidelines provided by her primary healthcare provider.  Based on the reported personal and family history, specific cancer screenings for Lauren Duarte and her family include:  Breast Cancer Screening:  Multiple prediction models have been developed to assist with breast cancer risk prediction efforts for unaffected women without a known single gene risk factor identified in their family. The Tyrer-Cuzick model is one such risk assessment tool. This model was developed to include extensive family history information, endogenous estrogen exposure, and benign breast disease. The calculation is highly-dependent on the accuracy of clinical data provided by the patient. Other factors not accounted for in the calculation may impact lifetime breast cancer risk including, but not limited to, germline mutations not analyzed by the ordered genetic test or clinical information not provided at the time of the testing. The risk number provided is patient-specific and cannot be used to infer risk to relatives.  Lauren Duarte'sTyrer-Cuzick risk score is 13.6%. She is encouraged to continue to be mindful of her family history and be diligent with general population breast screening, including annual mammograms. She is encouraged to contact us regarding any changes to her personal or family history, as her recommendations for screening would be altered significantly if her lifetime risk is determined to be greater than 20% based on updated information.     RECOMMENDATIONS FOR FAMILY MEMBERS:   Since she did not inherit a mutation in a cancer predisposition gene included on this panel, her son could not have inherited a mutation from her in one of these genes. Other members of the family may still carry a pathogenic variant in one of these genes that Lauren Duarte did not inherit. Based on the family history, we recommend her sister who has a history of breast cancer pursue genetic counseling and testing.   FOLLOW-UP:  Cancer genetics is a rapidly advancing field and it is possible that new genetic tests will be appropriate for her and/or her family members in the future. We encouraged her to remain in contact with cancer genetics on an annual basis so we can update her personal and family histories and let her know of advances in cancer genetics that may benefit this family.   Our contact number was provided. Lauren Duarte questions were answered to her satisfaction, and she knows she is welcome to call us at anytime with additional questions or concerns.   Lucille Passy, MS, [facility] Genetic Counselor [email or web address] (P) [phone]

## 2022-04-25 ENCOUNTER — Encounter: Payer: Self-pay | Admitting: Genetic Counselor

## 2022-12-06 ENCOUNTER — Telehealth: Payer: BC Managed Care – PPO | Admitting: Physician Assistant

## 2022-12-06 DIAGNOSIS — R6889 Other general symptoms and signs: Secondary | ICD-10-CM | POA: Diagnosis not present

## 2022-12-06 MED ORDER — BENZONATATE 100 MG PO CAPS: 100.0000 mg | ORAL_CAPSULE | Freq: Three times a day (TID) | ORAL | 0 refills | Status: AC | PRN

## 2022-12-06 NOTE — Progress Notes (Signed)
E visit for Flu like symptoms   We are sorry that you are not feeling well.  Here is how we plan to help! Based on what you have shared with me it looks like you may have flu-like symptoms that should be watched but do not seem to indicate anti-viral treatment.  Influenza or "the flu" is   an infection caused by a respiratory virus. The flu virus is highly contagious and persons who did not receive their yearly flu vaccination may "catch" the flu from close contact.  We have anti-viral medications to treat the viruses that cause this infection. They are not a "cure" and only shorten the course of the infection. These prescriptions are most effective when they are given within the first 2 days of "flu" symptoms. Antiviral medication are indicated if you have a high risk of complications from the flu. You should  also consider an antiviral medication if you are in close contact with someone who is at risk. These medications can help patients avoid complications from the flu  but have side effects that you should know. Possible side effects from Tamiflu or oseltamivir include nausea, vomiting, diarrhea, dizziness, headaches, eye redness, sleep problems or other respiratory symptoms. You should not take Tamiflu if you have an allergy to oseltamivir or any to the ingredients in Tamiflu.  Based upon your symptoms and potential risk factors I recommend that you follow the flu symptoms recommendation that I have listed below.  Please keep well-hydrated and try to get plenty of rest. If you have a humidifier, place it in the bedroom and run it at night. Start a saline nasal rinse for nasal congestion. You can consider use of a nasal steroid spray like Flonase or Nasacort OTC. You can alternate between Tylenol and Ibuprofen if needed for fever, body aches, headache and/or throat pain. Salt water-gargles and chloraseptic spray can be very beneficial for sore throat. Mucinex-DM for congestion or cough. I have  prescribed Tessalon to help with the cough. Please take all prescribed medications as directed.  Remain out of work until [employer] for 24 hours without a fever-reducing medication, and you are feeling better.  You should mask until symptoms are resolved.  If anything worsens despite treatment, you need to be evaluated in-person. Please do not delay care.  Providers prescribe antibiotics to treat infections caused by bacteria. Antibiotics are very powerful in treating bacterial infections when they are used properly. To maintain their effectiveness, they should be used only when necessary. Overuse of antibiotics has resulted in the development of superbugs that are resistant to treatment!    After careful review of your answers, I would not recommend an antibiotic for your condition.  Antibiotics are not effective against viruses and therefore should not be used to treat them. Common examples of infections caused by viruses include colds and flu     ANYONE WHO HAS FLU SYMPTOMS SHOULD: Stay home. The flu is highly contagious and going out or to work exposes others! Be sure to drink plenty of fluids. Water is fine as well as fruit juices, sodas and electrolyte beverages. You may want to stay away from caffeine or alcohol. If you are nauseated, try taking small sips of liquids. How do you know if you are getting enough fluid? Your urine should be a pale yellow or almost colorless. Get rest. Taking a steamy shower or using a humidifier may help nasal congestion and ease sore throat pain. Using a saline nasal spray works much the same  way. Cough drops, hard candies and sore throat lozenges may ease your cough. Line up a caregiver. Have someone check on you regularly.   GET HELP RIGHT AWAY IF: You cannot keep down liquids or your medications. You become short of breath Your fell like you are going to pass out or loose consciousness. Your symptoms persist after you have completed your treatment  plan MAKE SURE YOU  Understand these instructions. Will watch your condition. Will get help right away if you are not doing well or get worse.  Your e-visit answers were reviewed by a board certified advanced clinical practitioner to complete your personal care plan.  Depending on the condition, your plan could have included both over the counter or prescription medications.  If there is a problem please reply  once you have received a response from your provider.  Your safety is important to [place].  If you have drug allergies check your prescription carefully.    You can use MyChart to ask questions about today's visit, request a non-urgent call back, or ask for a work or school excuse for 24 hours related to this e-Visit. If it has been greater than 24 hours you will need to follow up with your provider, or enter a new e-Visit to address those concerns.  You will get an e-mail in the next two days asking about your experience.  I hope that your e-visit has been valuable and will speed your recovery. Thank you for using e-visits.

## 2022-12-06 NOTE — Progress Notes (Signed)
Message sent to patient requesting further input regarding current symptoms. Awaiting patient response.  

## 2022-12-06 NOTE — Progress Notes (Signed)
I have spent 5 minutes in review of e-visit questionnaire, review and updating patient chart, medical decision making and response to patient.   William Cody Martin, PA-C    

## 2022-12-11 ENCOUNTER — Encounter: Payer: BC Managed Care – PPO | Admitting: Nurse Practitioner

## 2022-12-11 NOTE — Progress Notes (Deleted)
Lauren Duarte, CMA,acting as a Neurosurgeon for Lauren Felts, FNP.,have documented all relevant documentation on the behalf of Lauren Felts, FNP,as directed by  Lauren Felts, FNP while in the presence of Lauren Felts, FNP.  Subjective:    Patient ID: Lauren Duarte , female    DOB: 24-Jan-1976 , 47 y.o.   MRN: [id number]  No chief complaint on file.   HPI  Patient presents today for HM, patient reports compliance with medications. Patient denies any chest pain, SOB, or headaches. Patient has no other concerns today.     Past Medical History:  Diagnosis Date  . Abnormal Pap smear 06/18/07   ASCUS  . Breast nodule    left breast  . BV (bacterial vaginosis)   . HSV-1 (herpes simplex virus 1) infection   . Pelvic pain in female   . UTI (urinary tract infection)      Family History  Problem Relation Age of Onset  . Thyroid disease Mother   . Healthy Father   . Breast cancer Sister 38  . Ovarian cancer Maternal Grandmother 40  . Alzheimer's disease Maternal Grandfather   . Diabetes Paternal Grandmother   . Breast cancer Other   . Testicular cancer Cousin 17       maternal first cousin     Current Outpatient Medications:  .  benzonatate (TESSALON) 100 MG capsule, Take 1 capsule (100 mg total) by mouth 3 (three) times daily as needed for cough., Disp: 30 capsule, Rfl: 0 .  IUD's (PARAGARD INTRAUTERINE COPPER IU), by Intrauterine route., Disp: , Rfl:  .  Multiple Vitamins-Minerals (MULTIVITAMINS THER. W/MINERALS) TABS, Take 1 tablet by mouth daily., Disp: , Rfl:    Allergies  Allergen Reactions  . Penicillins Itching and Swelling      The patient states she uses {contraceptive methods:5051} for birth control. No LMP recorded. (Menstrual status: IUD).. {Dysmenorrhea-menorrhagia:21918}. Negative for: breast discharge, breast lump(s), breast pain and breast self exam. Associated symptoms include abnormal vaginal bleeding. Pertinent negatives include abnormal bleeding (hematology),  anxiety, decreased libido, depression, difficulty falling sleep, dyspareunia, history of infertility, nocturia, sexual dysfunction, sleep disturbances, urinary incontinence, urinary urgency, vaginal discharge and vaginal itching. Diet regular.The patient states her exercise level is    . The patient's tobacco use is:  Social History   Tobacco Use  Smoking Status Former  Smokeless Tobacco Never  . She has been exposed to passive smoke. The patient's alcohol use is:  Social History   Substance and Sexual Activity  Alcohol Use Yes  . Additional information: Last pap ***, next one scheduled for ***.    Review of Systems  Constitutional: Negative.   HENT: Negative.    Eyes: Negative.   Respiratory: Negative.    Cardiovascular: Negative.   Gastrointestinal: Negative.   Endocrine: Negative.   Genitourinary: Negative.   Musculoskeletal: Negative.   Skin: Negative.   Allergic/Immunologic: Negative.   Neurological: Negative.   Hematological: Negative.   Psychiatric/Behavioral: Negative.      There were no vitals filed for this visit. There is no height or weight on file to calculate BMI.  Wt Readings from Last 3 Encounters:  12/07/21 141 lb (64 kg)  06/17/20 136 lb 12.8 oz (62.1 kg)  05/29/19 136 lb 12.8 oz (62.1 kg)     Objective:  Physical Exam      Assessment And Plan:     Encounter for annual health examination     No follow-ups on file. Patient was given opportunity to ask questions. Patient  verbalized understanding of the plan and was able to repeat key elements of the plan. All questions were answered to their satisfaction.   Lauren Felts, FNP  I, Lauren Felts, FNP, have reviewed all documentation for this visit. The documentation on 12/11/22 for the exam, diagnosis, procedures, and orders are all accurate and complete.

## 2023-04-20 ENCOUNTER — Telehealth: Payer: BC Managed Care – PPO

## 2023-04-24 ENCOUNTER — Encounter (HOSPITAL_COMMUNITY): Payer: Self-pay

## 2023-04-24 ENCOUNTER — Other Ambulatory Visit: Payer: Self-pay

## 2023-04-24 ENCOUNTER — Emergency Department (HOSPITAL_COMMUNITY): Payer: BC Managed Care – PPO

## 2023-04-24 ENCOUNTER — Emergency Department (HOSPITAL_COMMUNITY)
Admission: EM | Admit: 2023-04-24 | Discharge: 2023-04-24 | Discharge status: Against Medical Advice | Payer: BC Managed Care – PPO | Attending: Emergency Medicine | Admitting: Emergency Medicine

## 2023-04-24 DIAGNOSIS — S0990XA Unspecified injury of head, initial encounter: Secondary | ICD-10-CM | POA: Insufficient documentation

## 2023-04-24 DIAGNOSIS — Z5321 Procedure and treatment not carried out due to patient leaving prior to being seen by health care provider: Secondary | ICD-10-CM | POA: Insufficient documentation

## 2023-04-24 DIAGNOSIS — R42 Dizziness and giddiness: Secondary | ICD-10-CM | POA: Insufficient documentation

## 2023-04-24 DIAGNOSIS — W01198A Fall on same level from slipping, tripping and stumbling with subsequent striking against other object, initial encounter: Secondary | ICD-10-CM | POA: Insufficient documentation

## 2023-04-24 DIAGNOSIS — R110 Nausea: Secondary | ICD-10-CM | POA: Diagnosis not present

## 2023-04-24 DIAGNOSIS — M542 Cervicalgia: Secondary | ICD-10-CM | POA: Insufficient documentation

## 2023-04-24 MED ORDER — ONDANSETRON 4 MG PO TBDP
4.0000 mg | ORAL_TABLET | Freq: Once | ORAL | Status: AC
  Administered 2023-04-24: 4 mg via ORAL
  Filled 2023-04-24: qty 1

## 2023-04-24 NOTE — ED Triage Notes (Signed)
Pt fell off of her patio and hit the back of her head on cement. Pt has a lump on the back of her head.

## 2023-04-24 NOTE — ED Provider Triage Note (Signed)
 Emergency Medicine Provider Triage Evaluation Note  Lauren Duarte , a 47 y.o. female  was evaluated in triage.  Pt complains of severe headache, nausea, dizziness after a mechanical fall.  She states she slipped and struck the back of her head on a concrete pad.  No LOC.  Endorses left sided neck pain also.  Review of Systems  Positive: As above Negative: As above  Physical Exam  There were no vitals taken for this visit. Gen:   Awake, no distress   Resp:  Normal effort  MSK:   Moves extremities without difficulty  Other:    Medical Decision Making  Medically screening exam initiated at 3:12 PM.  Appropriate orders placed.  Taylin Mans was informed that the remainder of the evaluation will be completed by another provider, this initial triage assessment does not replace that evaluation, and the importance of remaining in the ED until their evaluation is complete.     Gretta Sayres R, PA-C 04/24/23 1513

## 2023-04-25 ENCOUNTER — Telehealth: Payer: 59 | Admitting: Physician Assistant

## 2023-04-25 DIAGNOSIS — F339 Major depressive disorder, recurrent, unspecified: Secondary | ICD-10-CM | POA: Diagnosis not present

## 2023-04-25 MED ORDER — ESCITALOPRAM OXALATE 10 MG PO TABS: ORAL_TABLET | ORAL | 0 refills | Status: AC

## 2023-04-25 NOTE — Progress Notes (Signed)
 Virtual Visit Consent   Lauren Duarte, you are scheduled for a virtual visit with a [facility] provider today. Just as with appointments in the office, your consent must be obtained to participate. Your consent will be active for this visit and any virtual visit you may have with one of our providers in the next 365 days. If you have a MyChart account, a copy of this consent can be sent to you electronically.  As this is a virtual visit, video technology does not allow for your provider to perform a traditional examination. This may limit your provider's ability to fully assess your condition. If your provider identifies any concerns that need to be evaluated in person or the need to arrange testing (such as labs, EKG, etc.), we will make arrangements to do so. Although advances in technology are sophisticated, we cannot ensure that it will always work on either your end or our end. If the connection with a video visit is poor, the visit may have to be switched to a telephone visit. With either a video or telephone visit, we are not always able to ensure that we have a secure connection.  By engaging in this virtual visit, you consent to the provision of healthcare and authorize for your insurance to be billed (if applicable) for the services provided during this visit. Depending on your insurance coverage, you may receive a charge related to this service.  I need to obtain your verbal consent now. Are you willing to proceed with your visit today? Lauren Duarte has provided verbal consent on 04/25/2023 for a virtual visit (video or telephone). Lauren CHRISTELLA Dickinson, PA-C  Date: 04/25/2023 9:02 AM  Virtual Visit via Video Note   I, Lauren Duarte, connected with  Lauren Duarte  ([id number], 05/15/1975) on 04/25/23 at  9:00 AM EST by a video-enabled telemedicine application and verified that I am speaking with the correct person using two identifiers.  Location: Patient: Virtual Visit Location Patient:  Home Provider: Virtual Visit Location Provider: Home Office   I discussed the limitations of evaluation and management by telemedicine and the availability of in person appointments. The patient expressed understanding and agreed to proceed.    History of Present Illness: Lauren Duarte is a 48 y.o. who identifies as a female who was assigned female at birth, and is being seen today for depression. Reports symptoms present for a year. She has been seeing a Paramedic. Having increased depression and anxiety. Has used Lexapro  in the past successfully. Son has severe mental illness that has been triggering for her at this time. Denies SI/HI.   Problems:  Patient Active Problem List   Diagnosis Date Noted   Genetic testing 04/13/2022   Family history of breast cancer 03/24/2022   Family history of ovarian cancer 03/24/2022    Allergies:  Allergies  Allergen Reactions   Penicillins Itching and Swelling   Medications:  Current Outpatient Medications:    benzonatate  (TESSALON ) 100 MG capsule, Take 1 capsule (100 mg total) by mouth 3 (three) times daily as needed for cough., Disp: 30 capsule, Rfl: 0   IUD's (PARAGARD INTRAUTERINE COPPER IU), by Intrauterine route., Disp: , Rfl:    Multiple Vitamins-Minerals (MULTIVITAMINS THER. W/MINERALS) TABS, Take 1 tablet by mouth daily., Disp: , Rfl:   Observations/Objective: Patient is well-developed, well-nourished in no acute distress.  Resting comfortably at home.  Head is normocephalic, atraumatic.  No labored breathing.  Speech is clear and coherent with logical content.  Patient is alert and  oriented at baseline.    Assessment and Plan: There are no diagnoses linked to this encounter. - Lexapro  restarted - Follow up with PCP for medication management - Seek in person evaluation if symptoms worsen or fail to improve  Follow Up Instructions: I discussed the assessment and treatment plan with the patient. The patient was provided an  opportunity to ask questions and all were answered. The patient agreed with the plan and demonstrated an understanding of the instructions.  A copy of instructions were sent to the patient via MyChart unless otherwise noted below.    The patient was advised to call back or seek an in-person evaluation if the symptoms worsen or if the condition fails to improve as anticipated.    Lauren CHRISTELLA Dickinson, PA-C

## 2023-04-25 NOTE — Patient Instructions (Signed)
 Ike Mate, thank you for joining Delon CHRISTELLA Dickinson, PA-C for today's virtual visit.  While this provider is not your primary care provider (PCP), if your PCP is located in our provider database this encounter information will be shared with them immediately following your visit.   A [facility] MyChart account gives you access to today's visit and all your visits, tests, and labs performed at [facility]  click here if you don't have a [facility] MyChart account or go to [email or web address]  Consent: (Patient) Lauren Duarte provided verbal consent for this virtual visit at the beginning of the encounter.  Current Medications:  Current Outpatient Medications:    escitalopram  (LEXAPRO ) 10 MG tablet, Start 0.5 tablet (5mg ) PO daily for 1 week, then increase to 1 tablet (10mg ) PO daily, Disp: 90 tablet, Rfl: 0   benzonatate  (TESSALON ) 100 MG capsule, Take 1 capsule (100 mg total) by mouth 3 (three) times daily as needed for cough., Disp: 30 capsule, Rfl: 0   IUD's (PARAGARD INTRAUTERINE COPPER IU), by Intrauterine route., Disp: , Rfl:    Multiple Vitamins-Minerals (MULTIVITAMINS THER. W/MINERALS) TABS, Take 1 tablet by mouth daily., Disp: , Rfl:    Medications ordered in this encounter:  Meds ordered this encounter  Medications   escitalopram  (LEXAPRO ) 10 MG tablet    Sig: Start 0.5 tablet (5mg ) PO daily for 1 week, then increase to 1 tablet (10mg ) PO daily    Dispense:  90 tablet    Refill:  0    Supervising Provider:   LAMPTEY, PHILIP O [[id number]]     *If you need refills on other medications prior to your next appointment, please contact your pharmacy*  Follow-Up: Call back or seek an in-person evaluation if the symptoms worsen or if the condition fails to improve as anticipated.  [facility] [phone]  Other Instructions Managing Depression, Adult Depression is a mental health condition that affects your thoughts, feelings, and actions. Being  diagnosed with depression can bring you relief if you did not know why you have felt or behaved a certain way. It could also leave you feeling overwhelmed. Finding ways to manage your symptoms can help you feel more positive about your future. How to manage lifestyle changes Being depressed is difficult. Depression can increase the level of everyday stress. Stress can make depression symptoms worse. You may believe your symptoms cannot be managed or will never improve. However, there are many things you can try to help manage your symptoms. There is hope. Managing stress  Stress is your body's reaction to life changes and events, both good and bad. Stress can add to your feelings of depression. Learning to manage your stress can help lessen your feelings of depression. Try some of the following approaches to reducing your stress (stress reduction techniques): Listen to music that you enjoy and that inspires you. Try using a meditation app or take a meditation class. Develop a practice that helps you connect with your spiritual self. Walk in nature, pray, or go to a place of worship. Practice deep breathing. To do this, inhale slowly through your nose. Pause at the top of your inhale for a few seconds and then exhale slowly, letting yourself relax. Repeat this three or four times. Practice yoga to help relax and work your muscles. Choose a stress reduction technique that works for you. These techniques take time and practice to develop. Set aside 5-15 minutes a day to do them. Therapists can offer training in these techniques.  Do these things to help manage stress: Keep a journal. Know your limits. Set healthy boundaries for yourself and others, such as saying no when you think something is too much. Pay attention to how you react to certain situations. You may not be able to control everything, but you can change your reaction. Add humor to your life by watching funny movies or shows. Make time  for activities that you enjoy and that relax you. Spend less time using electronics, especially at night before bed. The light from screens can make your brain think it is time to get up rather than go to bed.  Medicines Medicines, such as antidepressants, are often a part of treatment for depression. Talk with your pharmacist or health care provider about all the medicines, supplements, and herbal products that you take, their possible side effects, and what medicines and other products are safe to take together. Make sure to report any side effects you may have to your health care provider. Relationships Your health care provider may suggest family therapy, couples therapy, or individual therapy as part of your treatment. How to recognize changes Everyone responds differently to treatment for depression. As you recover from depression, you may start to: Have more interest in doing activities. Feel more hopeful. Have more energy. Eat a more regular amount of food. Have better mental focus. It is important to recognize if your depression is not getting better or is getting worse. The symptoms you had in the beginning may return, such as: Feeling tired. Eating too much or too little. Sleeping too much or too little. Feeling restless, agitated, or hopeless. Trouble focusing or making decisions. Having unexplained aches and pains. Feeling irritable, angry, or aggressive. If you or your family members notice these symptoms coming back, let your health care provider know right away. Follow these instructions at home: Activity Try to get some form of exercise each day, such as walking. Try yoga, mindfulness, or other stress reduction techniques. Participate in group activities if you are able. Lifestyle Get enough sleep. Cut down on or stop using caffeine, tobacco, alcohol, and any other harmful substances. Eat a healthy diet that includes plenty of vegetables, fruits, whole grains,  low-fat dairy products, and lean protein. Limit foods that are high in solid fats, added sugar, or salt (sodium). General instructions Take over-the-counter and prescription medicines only as told by your health care provider. Keep all follow-up visits. It is important for your health care provider to check on your mood, behavior, and medicines. Your health care provider may need to make changes to your treatment. Where to find support Talking to others  Friends and family members can be sources of support and guidance. Talk to trusted friends or family members about your condition. Explain your symptoms and let them know that you are working with a health care provider to treat your depression. Tell friends and family how they can help. Finances Find mental health providers that fit with your financial situation. Talk with your health care provider if you are worried about access to food, housing, or medicine. Call your insurance company to learn about your co-pays and prescription plan. Where to find more information You can find support in your area from: Anxiety and Depression Association of America (ADAA): adaa.org Mental Health America: mentalhealthamerica.net [employer] on Mental Illness: nami.org Contact a health care provider if: You stop taking your antidepressant medicines, and you have any of these symptoms: Nausea. Headache. Light-headedness. Chills and body aches. Not being able  to sleep (insomnia). You or your friends and family think your depression is getting worse. Get help right away if: You have thoughts of hurting yourself or others. Get help right away if you feel like you may hurt yourself or others, or have thoughts about taking your own life. Go to your nearest emergency room or: Call 911. Call the National Suicide Prevention Lifeline at (219)464-6028 or 988. This is open 24 hours a day. Text the Crisis Text Line at 339-179-2702. This information is not  intended to replace advice given to you by your health care provider. Make sure you discuss any questions you have with your health care provider. Document Revised: 08/16/2021 Document Reviewed: 08/16/2021 Elsevier Patient Education  2024 Elsevier Inc.    If you have been instructed to have an in-person evaluation today at a local Urgent Care facility, please use the link below. It will take you to a list of all of our available [facility], including address, phone number and hours of operation. Please do not delay care.  [facility]  If you or a family member do not have a primary care provider, use the link below to schedule a visit and establish care. When you choose a [facility] primary care physician or advanced practice provider, you gain a long-term partner in health. Find a Primary Care Provider  Learn more about [facility] in-office and virtual care options: [facility] - Get Care Now

## 2024-03-27 ENCOUNTER — Telehealth: Payer: Self-pay

## 2024-03-27 NOTE — Telephone Encounter (Signed)
 Spoke with patient- to see if she was still a patient here if so she needs to schedule appt ASAP.  Patient states she would call back to schedule.
